# Patient Record
Sex: Male | Born: 1960 | Race: Black or African American | Hispanic: No | Smoking: Current every day smoker
Health system: Southern US, Community
[De-identification: ages and names within clinical notes are randomized; demographics above are authoritative.]

## PROBLEM LIST (undated history)

## (undated) DIAGNOSIS — N186 End stage renal disease: Secondary | ICD-10-CM

## (undated) DIAGNOSIS — E11319 Type 2 diabetes mellitus with unspecified diabetic retinopathy without macular edema: Secondary | ICD-10-CM

## (undated) DIAGNOSIS — K219 Gastro-esophageal reflux disease without esophagitis: Secondary | ICD-10-CM

## (undated) DIAGNOSIS — J449 Chronic obstructive pulmonary disease, unspecified: Secondary | ICD-10-CM

## (undated) DIAGNOSIS — Z992 Dependence on renal dialysis: Secondary | ICD-10-CM

## (undated) DIAGNOSIS — I10 Essential (primary) hypertension: Secondary | ICD-10-CM

## (undated) DIAGNOSIS — I5032 Chronic diastolic (congestive) heart failure: Secondary | ICD-10-CM

## (undated) DIAGNOSIS — R0602 Shortness of breath: Secondary | ICD-10-CM

## (undated) DIAGNOSIS — I2510 Atherosclerotic heart disease of native coronary artery without angina pectoris: Secondary | ICD-10-CM

## (undated) DIAGNOSIS — I209 Angina pectoris, unspecified: Secondary | ICD-10-CM

## (undated) DIAGNOSIS — E785 Hyperlipidemia, unspecified: Secondary | ICD-10-CM

## (undated) DIAGNOSIS — F32A Depression, unspecified: Secondary | ICD-10-CM

## (undated) DIAGNOSIS — F329 Major depressive disorder, single episode, unspecified: Secondary | ICD-10-CM

## (undated) DIAGNOSIS — M549 Dorsalgia, unspecified: Secondary | ICD-10-CM

## (undated) DIAGNOSIS — D649 Anemia, unspecified: Secondary | ICD-10-CM

## (undated) DIAGNOSIS — G4730 Sleep apnea, unspecified: Secondary | ICD-10-CM

## (undated) HISTORY — PX: COLONOSCOPY: SHX174

## (undated) HISTORY — PX: EYE SURGERY: SHX253

## (undated) HISTORY — PX: LUNG BIOPSY: SHX232

## (undated) HISTORY — DX: Anemia, unspecified: D64.9

---

## 1998-08-23 ENCOUNTER — Emergency Department (HOSPITAL_COMMUNITY): Admission: EM | Admit: 1998-08-23 | Discharge: 1998-08-23 | Payer: Self-pay | Admitting: *Deleted

## 2002-05-09 ENCOUNTER — Encounter: Payer: Self-pay | Admitting: *Deleted

## 2002-05-09 ENCOUNTER — Emergency Department (HOSPITAL_COMMUNITY): Admission: EM | Admit: 2002-05-09 | Discharge: 2002-05-09 | Payer: Self-pay | Admitting: Emergency Medicine

## 2010-07-22 ENCOUNTER — Encounter: Payer: Self-pay | Admitting: Student

## 2010-07-22 ENCOUNTER — Emergency Department (INDEPENDENT_AMBULATORY_CARE_PROVIDER_SITE_OTHER): Payer: Medicare Other

## 2010-07-22 ENCOUNTER — Emergency Department (HOSPITAL_BASED_OUTPATIENT_CLINIC_OR_DEPARTMENT_OTHER)
Admission: EM | Admit: 2010-07-22 | Discharge: 2010-07-22 | Disposition: A | Discharge status: Other Acute Inpatient Hospital | Payer: Medicare Other | Attending: Emergency Medicine | Admitting: Emergency Medicine

## 2010-07-22 ENCOUNTER — Other Ambulatory Visit: Payer: Self-pay

## 2010-07-22 DIAGNOSIS — R0609 Other forms of dyspnea: Secondary | ICD-10-CM | POA: Insufficient documentation

## 2010-07-22 DIAGNOSIS — R0602 Shortness of breath: Secondary | ICD-10-CM

## 2010-07-22 DIAGNOSIS — R739 Hyperglycemia, unspecified: Secondary | ICD-10-CM

## 2010-07-22 DIAGNOSIS — J90 Pleural effusion, not elsewhere classified: Secondary | ICD-10-CM

## 2010-07-22 DIAGNOSIS — IMO0001 Reserved for inherently not codable concepts without codable children: Secondary | ICD-10-CM

## 2010-07-22 DIAGNOSIS — R0989 Other specified symptoms and signs involving the circulatory and respiratory systems: Secondary | ICD-10-CM | POA: Insufficient documentation

## 2010-07-22 DIAGNOSIS — I509 Heart failure, unspecified: Secondary | ICD-10-CM | POA: Insufficient documentation

## 2010-07-22 DIAGNOSIS — J449 Chronic obstructive pulmonary disease, unspecified: Secondary | ICD-10-CM | POA: Insufficient documentation

## 2010-07-22 DIAGNOSIS — R079 Chest pain, unspecified: Secondary | ICD-10-CM

## 2010-07-22 DIAGNOSIS — R05 Cough: Secondary | ICD-10-CM

## 2010-07-22 DIAGNOSIS — J4489 Other specified chronic obstructive pulmonary disease: Secondary | ICD-10-CM | POA: Insufficient documentation

## 2010-07-22 DIAGNOSIS — I10 Essential (primary) hypertension: Secondary | ICD-10-CM | POA: Insufficient documentation

## 2010-07-22 DIAGNOSIS — E1169 Type 2 diabetes mellitus with other specified complication: Secondary | ICD-10-CM | POA: Insufficient documentation

## 2010-07-22 DIAGNOSIS — M549 Dorsalgia, unspecified: Secondary | ICD-10-CM | POA: Insufficient documentation

## 2010-07-22 HISTORY — DX: Chronic obstructive pulmonary disease, unspecified: J44.9

## 2010-07-22 HISTORY — DX: Atherosclerotic heart disease of native coronary artery without angina pectoris: I25.10

## 2010-07-22 HISTORY — DX: Essential (primary) hypertension: I10

## 2010-07-22 HISTORY — DX: Dorsalgia, unspecified: M54.9

## 2010-07-22 LAB — CARDIAC PANEL(CRET KIN+CKTOT+MB+TROPI)
Relative Index: 1.1 (ref 0.0–2.5)
Total CK: 330 U/L — ABNORMAL HIGH (ref 7–232)

## 2010-07-22 LAB — COMPREHENSIVE METABOLIC PANEL
AST: 51 U/L — ABNORMAL HIGH (ref 0–37)
BUN: 24 mg/dL — ABNORMAL HIGH (ref 6–23)
CO2: 32 mEq/L (ref 19–32)
Chloride: 98 mEq/L (ref 96–112)
Creatinine, Ser: 1.8 mg/dL — ABNORMAL HIGH (ref 0.50–1.35)
GFR calc Af Amer: 49 mL/min — ABNORMAL LOW (ref >60)
GFR calc non Af Amer: 40 mL/min — ABNORMAL LOW (ref >60)
Glucose, Bld: 428 mg/dL — ABNORMAL HIGH (ref 70–99)
Total Bilirubin: 0.3 mg/dL (ref 0.3–1.2)

## 2010-07-22 LAB — CBC
HCT: 34.2 % — ABNORMAL LOW (ref 39.0–52.0)
Hemoglobin: 11 g/dL — ABNORMAL LOW (ref 13.0–17.0)
MCV: 86.8 fL (ref 78.0–100.0)
Platelets: 363 10*3/uL (ref 150–400)
RBC: 3.94 MIL/uL — ABNORMAL LOW (ref 4.22–5.81)
WBC: 7.7 10*3/uL (ref 4.0–10.5)

## 2010-07-22 MED ORDER — FUROSEMIDE 10 MG/ML IJ SOLN
60.0000 mg | Freq: Once | INTRAMUSCULAR | Status: AC
  Administered 2010-07-22: 60 mg via INTRAVENOUS
  Filled 2010-07-22: qty 10

## 2010-07-22 MED ORDER — NITROGLYCERIN 0.4 MG SL SUBL
0.4000 mg | SUBLINGUAL_TABLET | SUBLINGUAL | Status: AC | PRN
  Administered 2010-07-22 (×3): 0.4 mg via SUBLINGUAL

## 2010-07-22 MED ORDER — NITROGLYCERIN 0.4 MG SL SUBL
0.4000 mg | SUBLINGUAL_TABLET | SUBLINGUAL | Status: DC | PRN
  Administered 2010-07-22: 0.4 mg via SUBLINGUAL

## 2010-07-22 MED ORDER — INSULIN ASPART PROT & ASPART (70-30 MIX) 100 UNIT/ML ~~LOC~~ SUSP
10.0000 [IU] | Freq: Once | SUBCUTANEOUS | Status: AC
  Administered 2010-07-22: 10 [IU] via SUBCUTANEOUS
  Filled 2010-07-22 (×2): qty 3

## 2010-07-22 NOTE — ED Notes (Signed)
Patient is resting comfortably. Breathing easier and less tachypnea noted. RR rate at 22 even/unlaboured. Reports pain is decreasing to 6/10 s/p nitro SL. SBP down to 195/95. Pt showing multiform PVCs on 12 lead monitor. 2 attempts at IV placement unsuccessful, charge RN notified.

## 2010-07-22 NOTE — ED Notes (Signed)
All reports given to [facility], report to be called enroute to [facility].

## 2010-07-22 NOTE — ED Notes (Signed)
Room assignment received - 608 at [facility]. Wife informed.

## 2010-07-22 NOTE — ED Provider Notes (Addendum)
History     Chief Complaint  Patient presents with  . Shortness of Breath    SOB N V CP Edema   HPIPt presents with c/o shortness of breath.  Has been feeling this way over past several days, worse today.   Can only walk 2-3 steps without becoming more dyspneic.  Coughed up some pink sputum yesterday.  No fever/chills. Also has chronic lower extremity swelling which is better than usual.  Also notes facial edema especially Of eyelids.  Denies chest pain.  No fever.  Recently had diuretic dose increased.  And also recent hospitalization At [facility]- cardiologist is [facility]- Dr. Claiborne Billings.   Past Medical History  Diagnosis Date  . CHF (congestive heart failure)   . COPD (chronic obstructive pulmonary disease)   . Coronary artery disease   . Diabetes mellitus   . Hypertension   . Back pain     No past surgical history on file.  No family history on file.  History  Substance Use Topics  . Smoking status: Former Research scientist (life sciences)  . Smokeless tobacco: Never Used  . Alcohol Use: No      Review of Systems  Physical Exam  BP 210/120  Pulse 88  Temp(Src) 98.8 F (37.1 C) (Oral)  Resp 26  Wt 328 lb (148.78 kg)  SpO2 94%  Physical Exam  Vitals reviewed.  Gen- awake, alert, NAD Eyes- PERRL, EOMI, normal appearance HEENT- MMM, OP clear, [place] O2 in place Neck- supple, from Lungs-,decreased  breath sounds,crackles 1/2 way up lung fields + accessory muscle movement +dyspnea CV-RRR, no murmur/rub/gallop Abd- soft, nontender, mildly distended, normal active bowel sounds GU- no CVA tenderness Ext- 2-3+  peripheralpitting  Edema- up to knees bilaterally and symmetric, neurovascularly intact, 2+ dp pulses  ROS reviewed and otherwise negative except for mentioned in HPI  ED Course  Procedures Date: 07/22/2010  Rate: 83  Rhythm: normal sinus rhythm  QRS Axis: normal  Intervals: normal  ST/T Wave abnormalities: nonspecific ST/T changes  Conduction Disutrbances:none  Narrative  Interpretation: low voltage in frontal leads,   Old EKG Reviewed: none available    MDM CXR reviewed by me as well- c/w CHF Multiple attempts for IV, including ultrasound guided IV by myself- initially successful then became dislodged.  Finally right brachial IV placed.  Pt given lasix IM, also nitroglycerin for CHF and hypertension.  Pt has had at least 1.5Liters urine output.  Blood glucose elevated in 400s, given insulin SubQ 10 units.  Pt much less dyspneic on 6 liters [place].  Will need admission for CHF exacerbation.  Contacting [facility] for admission and plan to transfer to [place] [facility].   D/w [facility] cardiology- he recommended hospitalist admission, discussed with [facility] hospitalist and she sees patient just discharged from [facility] hospitalist service- paging that hospitalist now.  Report given to Dr. Maeola Harman, Regional hospitalist- pt to be transferred to tele bed- was discharged from their service 07/18/10.  Threasa Beards, MD 07/22/10 [place], MD 08/31/10 [phone]

## 2010-07-22 NOTE — ED Notes (Signed)
Pt in with c/o extreme SOB, N V CP Lower back pain that started yesterday, peripheral edema noted bilaterally and to his face. Pt unsure of weight gain over past 24 hrs. laboured breathing ntoed with in/expiratory wheezes noted. Pt reports tenacious cough with thick secreations. SOB with and without exertion, 59% o2 sats on 2L - pt bumped to 6L [place] to 94% and reports decreased effort to breath. EKG at bedside. Airway patent and pt able to maintain airway appropriately. Pt hypertensive and per wife, was able to take am meds this morning.

## 2010-12-12 ENCOUNTER — Encounter (HOSPITAL_BASED_OUTPATIENT_CLINIC_OR_DEPARTMENT_OTHER): Payer: Self-pay

## 2010-12-12 ENCOUNTER — Inpatient Hospital Stay (HOSPITAL_BASED_OUTPATIENT_CLINIC_OR_DEPARTMENT_OTHER)
Admission: EM | Admit: 2010-12-12 | Discharge: 2010-12-16 | DRG: 292 | Disposition: A | Discharge status: Home / Self Care | Payer: Medicare Other | Attending: Internal Medicine | Admitting: Internal Medicine

## 2010-12-12 ENCOUNTER — Emergency Department (INDEPENDENT_AMBULATORY_CARE_PROVIDER_SITE_OTHER): Payer: Medicare Other

## 2010-12-12 DIAGNOSIS — I509 Heart failure, unspecified: Secondary | ICD-10-CM | POA: Diagnosis present

## 2010-12-12 DIAGNOSIS — E162 Hypoglycemia, unspecified: Secondary | ICD-10-CM | POA: Diagnosis not present

## 2010-12-12 DIAGNOSIS — Z87891 Personal history of nicotine dependence: Secondary | ICD-10-CM

## 2010-12-12 DIAGNOSIS — D631 Anemia in chronic kidney disease: Secondary | ICD-10-CM | POA: Diagnosis present

## 2010-12-12 DIAGNOSIS — I5033 Acute on chronic diastolic (congestive) heart failure: Secondary | ICD-10-CM | POA: Diagnosis present

## 2010-12-12 DIAGNOSIS — N179 Acute kidney failure, unspecified: Secondary | ICD-10-CM | POA: Diagnosis present

## 2010-12-12 DIAGNOSIS — N189 Chronic kidney disease, unspecified: Secondary | ICD-10-CM | POA: Diagnosis present

## 2010-12-12 DIAGNOSIS — Z5900 Homelessness unspecified: Secondary | ICD-10-CM

## 2010-12-12 DIAGNOSIS — R0602 Shortness of breath: Secondary | ICD-10-CM

## 2010-12-12 DIAGNOSIS — I10 Essential (primary) hypertension: Secondary | ICD-10-CM | POA: Diagnosis present

## 2010-12-12 DIAGNOSIS — I517 Cardiomegaly: Secondary | ICD-10-CM

## 2010-12-12 DIAGNOSIS — Z794 Long term (current) use of insulin: Secondary | ICD-10-CM

## 2010-12-12 DIAGNOSIS — N039 Chronic nephritic syndrome with unspecified morphologic changes: Secondary | ICD-10-CM | POA: Diagnosis present

## 2010-12-12 DIAGNOSIS — J90 Pleural effusion, not elsewhere classified: Secondary | ICD-10-CM

## 2010-12-12 DIAGNOSIS — Z7982 Long term (current) use of aspirin: Secondary | ICD-10-CM

## 2010-12-12 DIAGNOSIS — E119 Type 2 diabetes mellitus without complications: Secondary | ICD-10-CM | POA: Diagnosis present

## 2010-12-12 DIAGNOSIS — F1020 Alcohol dependence, uncomplicated: Secondary | ICD-10-CM | POA: Diagnosis present

## 2010-12-12 DIAGNOSIS — N183 Chronic kidney disease, stage 3 unspecified: Secondary | ICD-10-CM | POA: Diagnosis present

## 2010-12-12 DIAGNOSIS — E7800 Pure hypercholesterolemia, unspecified: Secondary | ICD-10-CM | POA: Diagnosis present

## 2010-12-12 DIAGNOSIS — E1169 Type 2 diabetes mellitus with other specified complication: Secondary | ICD-10-CM | POA: Diagnosis present

## 2010-12-12 HISTORY — DX: Hyperlipidemia, unspecified: E78.5

## 2010-12-12 LAB — GLUCOSE, CAPILLARY
Glucose-Capillary: 218 mg/dL — ABNORMAL HIGH (ref 70–99)
Glucose-Capillary: 261 mg/dL — ABNORMAL HIGH (ref 70–99)

## 2010-12-12 LAB — CBC
HCT: 28.5 % — ABNORMAL LOW (ref 39.0–52.0)
HCT: 29.2 % — ABNORMAL LOW (ref 39.0–52.0)
Hemoglobin: 9.4 g/dL — ABNORMAL LOW (ref 13.0–17.0)
MCHC: 31.6 g/dL (ref 30.0–36.0)
MCV: 92.8 fL (ref 78.0–100.0)
MCV: 92.1 fL (ref 78.0–100.0)
Platelets: 214 10*3/uL (ref 150–400)
Platelets: 202 10*3/uL (ref 150–400)
RBC: 3.17 MIL/uL — ABNORMAL LOW (ref 4.22–5.81)
RDW: 13.5 % (ref 11.5–15.5)
WBC: 6.9 10*3/uL (ref 4.0–10.5)

## 2010-12-12 LAB — BASIC METABOLIC PANEL
Calcium: 8.9 mg/dL (ref 8.4–10.5)
Creatinine, Ser: 2 mg/dL — ABNORMAL HIGH (ref 0.50–1.35)
GFR calc Af Amer: 43 mL/min — ABNORMAL LOW (ref >90)

## 2010-12-12 LAB — DIFFERENTIAL
Basophils Absolute: 0.1 10*3/uL (ref 0.0–0.1)
Basophils Relative: 1 % (ref 0–1)
Eosinophils Relative: 4 % (ref 0–5)
Monocytes Absolute: 0.5 10*3/uL (ref 0.1–1.0)
Neutro Abs: 4.7 10*3/uL (ref 1.7–7.7)

## 2010-12-12 LAB — MAGNESIUM: Magnesium: 2.1 mg/dL (ref 1.5–2.5)

## 2010-12-12 LAB — CARDIAC PANEL(CRET KIN+CKTOT+MB+TROPI): Total CK: 1154 U/L — ABNORMAL HIGH (ref 7–232)

## 2010-12-12 LAB — PRO B NATRIURETIC PEPTIDE: Pro B Natriuretic peptide (BNP): 728.6 pg/mL — ABNORMAL HIGH (ref 0–125)

## 2010-12-12 LAB — CREATININE, SERUM: GFR calc Af Amer: 42 mL/min — ABNORMAL LOW (ref >90)

## 2010-12-12 LAB — RAPID URINE DRUG SCREEN, HOSP PERFORMED
Amphetamines: NOT DETECTED
Barbiturates: NOT DETECTED
Tetrahydrocannabinol: NOT DETECTED

## 2010-12-12 MED ORDER — INSULIN ASPART 100 UNIT/ML ~~LOC~~ SOLN
6.0000 [IU] | Freq: Three times a day (TID) | SUBCUTANEOUS | Status: DC
  Administered 2010-12-13 – 2010-12-16 (×8): 6 [IU] via SUBCUTANEOUS

## 2010-12-12 MED ORDER — BACLOFEN 20 MG PO TABS
20.0000 mg | ORAL_TABLET | Freq: Two times a day (BID) | ORAL | Status: DC
  Administered 2010-12-12 – 2010-12-16 (×8): 20 mg via ORAL
  Filled 2010-12-12 (×9): qty 1

## 2010-12-12 MED ORDER — FUROSEMIDE 10 MG/ML IJ SOLN
80.0000 mg | Freq: Once | INTRAMUSCULAR | Status: AC
  Administered 2010-12-12: 80 mg via INTRAVENOUS
  Filled 2010-12-12: qty 8

## 2010-12-12 MED ORDER — SODIUM CHLORIDE 0.9 % IJ SOLN
3.0000 mL | Freq: Two times a day (BID) | INTRAMUSCULAR | Status: DC
  Administered 2010-12-12 – 2010-12-16 (×8): 3 mL via INTRAVENOUS

## 2010-12-12 MED ORDER — TIMOLOL HEMIHYDRATE 0.25 % OP SOLN: 1.0000 [drp] | Freq: Two times a day (BID) | OPHTHALMIC | Status: DC

## 2010-12-12 MED ORDER — CARVEDILOL 3.125 MG PO TABS
3.1250 mg | ORAL_TABLET | Freq: Two times a day (BID) | ORAL | Status: DC
  Administered 2010-12-13 – 2010-12-16 (×6): 3.125 mg via ORAL
  Filled 2010-12-12 (×9): qty 1

## 2010-12-12 MED ORDER — HYDRALAZINE HCL 50 MG PO TABS
100.0000 mg | ORAL_TABLET | Freq: Three times a day (TID) | ORAL | Status: DC
  Administered 2010-12-12 – 2010-12-16 (×11): 100 mg via ORAL
  Filled 2010-12-12 (×14): qty 2

## 2010-12-12 MED ORDER — RAMIPRIL 5 MG PO CAPS
5.0000 mg | ORAL_CAPSULE | Freq: Every day | ORAL | Status: DC
  Administered 2010-12-12 – 2010-12-13 (×2): 5 mg via ORAL
  Filled 2010-12-12 (×2): qty 1

## 2010-12-12 MED ORDER — ASPIRIN 81 MG PO TBEC: 81.0000 mg | DELAYED_RELEASE_TABLET | Freq: Every day | ORAL | Status: DC

## 2010-12-12 MED ORDER — ROSUVASTATIN CALCIUM 40 MG PO TABS
40.0000 mg | ORAL_TABLET | Freq: Every day | ORAL | Status: DC
  Administered 2010-12-12 – 2010-12-15 (×4): 40 mg via ORAL
  Filled 2010-12-12 (×5): qty 1

## 2010-12-12 MED ORDER — ISOSORBIDE MONONITRATE ER 60 MG PO TB24
120.0000 mg | ORAL_TABLET | Freq: Every day | ORAL | Status: DC
  Administered 2010-12-13 – 2010-12-16 (×4): 120 mg via ORAL
  Filled 2010-12-12 (×4): qty 2

## 2010-12-12 MED ORDER — ONDANSETRON HCL 4 MG/2ML IJ SOLN: 4.0000 mg | Freq: Four times a day (QID) | INTRAMUSCULAR | Status: DC | PRN

## 2010-12-12 MED ORDER — ZOLPIDEM TARTRATE 5 MG PO TABS: 5.0000 mg | ORAL_TABLET | Freq: Every evening | ORAL | Status: DC | PRN

## 2010-12-12 MED ORDER — ACETAMINOPHEN 325 MG PO TABS
650.0000 mg | ORAL_TABLET | ORAL | Status: DC | PRN
Start: 2010-12-12 — End: 2010-12-16
  Administered 2010-12-16: 650 mg via ORAL
  Filled 2010-12-12: qty 2

## 2010-12-12 MED ORDER — BUPROPION HCL ER (SR) 150 MG PO TB12
150.0000 mg | ORAL_TABLET | Freq: Two times a day (BID) | ORAL | Status: DC
  Administered 2010-12-12 – 2010-12-16 (×8): 150 mg via ORAL
  Filled 2010-12-12 (×9): qty 1

## 2010-12-12 MED ORDER — INSULIN GLARGINE 100 UNIT/ML ~~LOC~~ SOLN: 15.0000 [IU] | Freq: Every day | SUBCUTANEOUS | Status: DC

## 2010-12-12 MED ORDER — SODIUM CHLORIDE 0.9 % IJ SOLN: 3.0000 mL | INTRAMUSCULAR | Status: DC | PRN

## 2010-12-12 MED ORDER — INSULIN DETEMIR 100 UNIT/ML ~~LOC~~ SOLN
40.0000 [IU] | Freq: Every day | SUBCUTANEOUS | Status: DC
  Administered 2010-12-13: 40 [IU] via SUBCUTANEOUS
  Filled 2010-12-12: qty 3

## 2010-12-12 MED ORDER — INSULIN ASPART 100 UNIT/ML ~~LOC~~ SOLN
0.0000 [IU] | Freq: Every day | SUBCUTANEOUS | Status: DC
  Administered 2010-12-12: 3 [IU] via SUBCUTANEOUS
  Administered 2010-12-13: 0 [IU] via SUBCUTANEOUS
  Administered 2010-12-14: 2 [IU] via SUBCUTANEOUS

## 2010-12-12 MED ORDER — ARIPIPRAZOLE 5 MG PO TABS
5.0000 mg | ORAL_TABLET | Freq: Every day | ORAL | Status: DC
  Administered 2010-12-12 – 2010-12-16 (×5): 5 mg via ORAL
  Filled 2010-12-12 (×7): qty 1

## 2010-12-12 MED ORDER — CITALOPRAM HYDROBROMIDE 20 MG PO TABS
20.0000 mg | ORAL_TABLET | Freq: Every day | ORAL | Status: DC
  Administered 2010-12-13 – 2010-12-16 (×4): 20 mg via ORAL
  Filled 2010-12-12 (×4): qty 1

## 2010-12-12 MED ORDER — FUROSEMIDE 10 MG/ML IJ SOLN
80.0000 mg | Freq: Two times a day (BID) | INTRAMUSCULAR | Status: DC
  Administered 2010-12-13: 80 mg via INTRAVENOUS
  Filled 2010-12-12 (×4): qty 8

## 2010-12-12 MED ORDER — SODIUM CHLORIDE 0.9 % IV SOLN
250.0000 mL | INTRAVENOUS | Status: DC | PRN
Start: 2010-12-12 — End: 2010-12-16

## 2010-12-12 MED ORDER — TIMOLOL MALEATE 0.25 % OP SOLN
1.0000 [drp] | Freq: Two times a day (BID) | OPHTHALMIC | Status: DC
  Administered 2010-12-12 – 2010-12-16 (×8): 1 [drp] via OPHTHALMIC
  Filled 2010-12-12 (×2): qty 5

## 2010-12-12 MED ORDER — PANTOPRAZOLE SODIUM 40 MG PO TBEC
40.0000 mg | DELAYED_RELEASE_TABLET | Freq: Two times a day (BID) | ORAL | Status: DC
  Administered 2010-12-12 – 2010-12-16 (×8): 40 mg via ORAL
  Filled 2010-12-12 (×6): qty 1

## 2010-12-12 MED ORDER — INSULIN ASPART 100 UNIT/ML ~~LOC~~ SOLN
0.0000 [IU] | Freq: Three times a day (TID) | SUBCUTANEOUS | Status: DC
  Administered 2010-12-13 (×2): 3 [IU] via SUBCUTANEOUS
  Administered 2010-12-14 – 2010-12-16 (×4): 4 [IU] via SUBCUTANEOUS
  Administered 2010-12-16: 3 [IU] via SUBCUTANEOUS
  Filled 2010-12-12: qty 3

## 2010-12-12 MED ORDER — HYDRALAZINE HCL 100 MG PO TABS: 100.0000 mg | ORAL_TABLET | Freq: Three times a day (TID) | ORAL | Status: DC

## 2010-12-12 MED ORDER — PERPHENAZINE 2 MG PO TABS
2.0000 mg | ORAL_TABLET | Freq: Every day | ORAL | Status: DC
  Administered 2010-12-12 – 2010-12-15 (×4): 2 mg via ORAL
  Filled 2010-12-12 (×7): qty 1

## 2010-12-12 MED ORDER — ASPIRIN EC 81 MG PO TBEC
81.0000 mg | DELAYED_RELEASE_TABLET | Freq: Every day | ORAL | Status: DC
  Administered 2010-12-12 – 2010-12-16 (×5): 81 mg via ORAL
  Filled 2010-12-12 (×5): qty 1

## 2010-12-12 MED ORDER — ENOXAPARIN SODIUM 40 MG/0.4ML ~~LOC~~ SOLN
40.0000 mg | SUBCUTANEOUS | Status: DC
  Administered 2010-12-12 – 2010-12-15 (×4): 40 mg via SUBCUTANEOUS
  Filled 2010-12-12 (×5): qty 0.4

## 2010-12-12 NOTE — ED Provider Notes (Signed)
History     CSN: [id number] Arrival date & time: 12/12/2010  9:12 AM   First MD Initiated Contact with Patient 12/12/10 [phone]      Chief Complaint  Patient presents with  . Leg Swelling  . Shortness of Breath    (Consider location/radiation/quality/duration/timing/severity/associated sxs/prior treatment) HPI  Past Medical History  Diagnosis Date  . CHF (congestive heart failure)   . COPD (chronic obstructive pulmonary disease)   . Coronary artery disease   . Diabetes mellitus   . Hypertension   . Back pain   . Hyperlipemia   . Renal insufficiency     Past Surgical History  Procedure Date  . Eye surgery   . Lung biopsy     No family history on file.  History  Substance Use Topics  . Smoking status: Current Everyday Smoker -- 0.5 packs/day    Types: Cigarettes  . Smokeless tobacco: Never Used  . Alcohol Use: Yes     states he drinks 1/5 of liquor dailt- last drink 30 days ago      Review of Systems  All other systems reviewed and are negative.    Allergies  Review of patient's allergies indicates no known allergies.  Home Medications   Current Outpatient Rx  Name Route Sig Dispense Refill  . ALBUTEROL SULFATE (2.5 MG/3ML) 0.083% IN NEBU Inhalation Inhale 2.5 mg into the lungs every 4 (four) hours as needed.      . ASPIRIN 81 MG PO TBEC Oral Take 81 mg by mouth daily.      Marland Kitchen HYDRALAZINE HCL 100 MG PO TABS Oral Take 100 mg by mouth 3 (three) times daily.      . INSULIN ASPART 100 UNIT/ML  SOLN Subcutaneous Inject 8 Units into the skin 3 (three) times daily before meals.      . TORSEMIDE 20 MG PO TABS Oral Take 20 mg by mouth 2 (two) times daily.        BP 188/84  Pulse 90  Temp(Src) 97.7 F (36.5 C) (Oral)  Resp 20  Ht 5\' 7"  (1.702 m)  Wt 279 lb (126.554 kg)  BMI 43.70 kg/m2  SpO2 98%  Physical Exam  Constitutional: He is oriented to person, place, and time.       Morbidly obese male  HENT:  Head: Normocephalic and atraumatic.  Eyes:  Conjunctivae and EOM are normal. Pupils are equal, round, and reactive to light.  Neck: Normal range of motion. Neck supple.  Cardiovascular: Normal rate, regular rhythm, normal heart sounds and intact distal pulses.   Pulmonary/Chest:       Decreased bs bilaterally with crackles  Abdominal: Soft. Bowel sounds are normal.  Musculoskeletal: He exhibits edema. He exhibits no tenderness.  Neurological: He is alert and oriented to person, place, and time. He has normal reflexes.  Skin: Skin is warm and dry.  Psychiatric: He has a normal mood and affect.    ED Course  Procedures (including critical care time)  Labs Reviewed - No data to display No results found.   No diagnosis found.    MDM   Date: 12/12/2010  Rate: 87  Rhythm: normal sinus rhythm  QRS Axis: normal  Intervals: normal  ST/T Wave abnormalities: normal  Conduction Disutrbances:none  Narrative Interpretation:   Old EKG Reviewed: unchanged   Patient diuresed here with Lasix 80 mg IV. Patient has been a 1 L of normal saline. Patient is normally admitted [facility]. Discussed his care with the [facility]ist at  [facility]. We are currently awaiting bed assignment. Patient's blood pressures remained stable with a systolic 0000000. His heart rate is in the 90s. His total CK is elevated at his troponin is normal. He will be transported to [facility] for further treatment of his congestive heart failure exacerbation        Shaune Pollack, MD 12/17/10 [phone]

## 2010-12-12 NOTE — ED Notes (Signed)
Pt returned from radiology.

## 2010-12-12 NOTE — ED Notes (Signed)
Secondary assessment- pt reports SHOB and generalized swelling in extremities x 3 days.  Denies chest pain.  Bilateral LE pitting edema noted.  Pt reports chronic mild swelling but swelling the past 3 days is worse.  Swelling noted to bilateral hands.

## 2010-12-12 NOTE — ED Notes (Signed)
Meal tray provided.  Urine sent as ordered.  Pt in NAD at present time and updated of plan of care.  Awaiting a bed.

## 2010-12-12 NOTE — Progress Notes (Signed)
Pt admitted to unit. Pt oriented to unit. Pt assessed and VS taken. Pt does not appear in any immediate distress.  Dr. Elenor Legato notified of pt's arrival to unit. Pt given "Living better with heart failure" packet.  Will continue to monitor.

## 2010-12-12 NOTE — ED Notes (Signed)
Attempted IV access x 1 attempt unsuccessful.

## 2010-12-12 NOTE — ED Notes (Signed)
Pt reports SHOB and O2 at [facility] applied.  Telemetry in place and NSR per monitor

## 2010-12-12 NOTE — ED Notes (Signed)
Patient transported to X-ray 

## 2010-12-12 NOTE — ED Notes (Signed)
Pt reports he had an episode of chest pain last night but denies any today. Dr. Jeanell Sparrow informed of lab results.

## 2010-12-12 NOTE — ED Notes (Signed)
[facility] at bedside for transfer.  Pt stable upon transfer to [place].

## 2010-12-12 NOTE — ED Notes (Signed)
[facility] has been called for transfer to [id number] at [facility]

## 2010-12-12 NOTE — ED Notes (Signed)
Report called to Joellen Jersey, RN unit RN

## 2010-12-12 NOTE — ED Notes (Signed)
Pt reports a 3 day hx of generalized swelling and SHOB.

## 2010-12-12 NOTE — H&P (Signed)
PATIENT DETAILS Name: Jon Russell Age: 50 y.o. Sex: male Date of Birth: 04-29-1960 Admit Date: 12/12/2010 PCP:No primary provider on file.   CHIEF COMPLAINT: My legs are swollen up Shortness of breath  HPI: This is a 50 year old man, homeless, alcoholic and drug abuser, with multiple medical problems including coronary artery disease, CHF, COPD, chronic renal insufficiency, diabetes and hypertension. [facility] because of worsening swelling of the lower extremities over the last 2-3 days associated to dyspnea on exertion and at rest. He did not complain of chest pain or palpitations. He denied fever or chills. Denied cough or sputum production.  Initial evaluation at [facility] revealed that the patient was in congestive heart failure. He received Lasix 80 mg IV with good diuresis. His shortness of breath improved. His initial cardiac markers were elevated except troponin. His urine drug screen was negative. He remains in renal failure with a BUN of 32 and creatinine of 2.0. Subsequently we were contacted for transfer to [place]:  No Known Allergies  PAST MEDICAL HISTORY: Past Medical History  Diagnosis Date  . CHF (congestive heart failure) patient cardiac care has been given at [facility] No cath or echo found on E chart   . COPD (chronic obstructive pulmonary disease)   . Coronary artery disease   . Diabetes mellitus   . Hypertension   . Back pain   . Hyperlipemia   . Renal insufficiency     PAST SURGICAL HISTORY: Past Surgical History  Procedure Date  . Eye surgery   . Lung biopsy     MEDICATIONS AT HOME: Prior to Admission medications   Medication Sig Start Date End Date Taking? Authorizing Provider  ARIPiprazole (ABILIFY) 5 MG tablet Take 5 mg by mouth daily.     Yes Historical Provider, MD  aspirin 81 MG EC tablet Take 81 mg by mouth daily.    Yes Historical Provider, MD    atorvastatin (LIPITOR) 40 MG tablet Take 40 mg by mouth daily.     Yes Historical Provider, MD  baclofen (LIORESAL) 20 MG tablet Take 20 mg by mouth 2 (two) times daily.     Yes Historical Provider, MD  buPROPion (WELLBUTRIN SR) 150 MG 12 hr tablet Take 150 mg by mouth 2 (two) times daily.     Yes Historical Provider, MD  citalopram (CELEXA) 20 MG tablet Take 20 mg by mouth daily.     Yes Historical Provider, MD  hydrALAZINE (APRESOLINE) 100 MG tablet Take 100 mg by mouth 3 (three) times daily.    Yes Historical Provider, MD  ibuprofen (ADVIL,MOTRIN) 200 MG tablet Take 800 mg by mouth every 8 (eight) hours as needed.     Yes Historical Provider, MD  insulin aspart (NOVOLOG) 100 UNIT/ML injection Inject 8 Units into the skin 3 (three) times daily before meals. Sliding scale   Yes Historical Provider, MD  insulin detemir (LEVEMIR) 100 UNIT/ML injection Inject 40 Units into the skin daily.     Yes Historical Provider, MD  isosorbide mononitrate (IMDUR) 60 MG 24 hr tablet Take 120 mg by mouth daily.     Yes Historical Provider, MD  methylcellulose (ARTIFICIAL TEARS) 1 % ophthalmic solution 1 drop as needed. For dry eye    Yes Historical Provider, MD  NIFEdipine (PROCARDIA XL/ADALAT-CC) 60 MG 24 hr tablet Take 60 mg by mouth daily.     Yes Historical Provider, MD  pantoprazole (  PROTONIX) 40 MG tablet Take 40 mg by mouth 2 (two) times daily.     Yes Historical Provider, MD  perphenazine (TRILAFON) 2 MG tablet Take 2 mg by mouth at bedtime.     Yes Historical Provider, MD  timolol (BETIMOL) 0.25 % ophthalmic solution Place 1 drop into both eyes 2 (two) times daily as needed.     Yes Historical Provider, MD  torsemide (DEMADEX) 20 MG tablet Take 20 mg by mouth 2 (two) times daily.    Yes Historical Provider, MD  albuterol (PROVENTIL) (2.5 MG/3ML) 0.083% nebulizer solution Inhale 2.5 mg into the lungs every 4 (four) hours as needed.      Historical Provider, MD    FAMILY HISTORY: "all the conditions that  I have they have"  SOCIAL HISTORY: Patient is homeless. He lives at [place]. He admits to alcohol and drug abuse but states that he quit 30 days ago. He has no family.  REVIEW OF SYSTEMS:  Constitutional:   Positive for night sweats but no fever chills or weight loss HEENT:    No headaches, Difficulty swallowing,Tooth/dental problems,Sore throat,  No sneezing, itching, ear ache, nasal congestion, post nasal drip,   Cardio-vascular: Complains of nocturia and orthopnea. No PND   GI:  No heartburn, indigestion, abdominal pain, nausea, vomiting, diarrhea, change in       bowel habits, loss of appetite  Resp: As in history of present illness  Skin:  no rash or lesions.  GU:  Occasional urinary hesitance no dysuria or hematuria   Musculoskeletal: No joint pain or swelling.  No decreased range of motion.  No back pain.  Marland Kitchen   PHYSICAL EXAM: Blood pressure 174/77, pulse 88, temperature 97.8 F (36.6 C), temperature source Oral, resp. rate 18, height 5\' 7"  (1.702 m), weight 268 lb 1.3 oz (121.6 kg), SpO2 97.00%.  General appearance :Awake, alert, not in any distress. Speech Clear. Obese  HEENT: Atraumatic and Normocephalic, pupils equally reactive to light and accomodation Neck: supple, no JVD. No cervical lymphadenopathy.  Chest: Diminished air entry bilaterally. Bilateral crackles at the bases  CVS: S1 S2 regular, no murmurs. No gallops Abdomen: Bowel sounds present, mildly tender in the lower quadrants not distended with no gaurding, rigidity or rebound. Extremities: 3+ edema edema bilaterally  Neurology: Awake alert, and oriented X 3, CN II-XII intact, Non focal, Deep Tendon Reflex-2+ all over, plantar's downgoing B/L, sensory exam is grossly intact.  Skin:No Rash Wounds:N/A  LABS ON ADMISSION:   Basename 12/12/10 1025  NA 140  K 4.8  CL 105  CO2 27  GLUCOSE 119*  BUN 32*  CREATININE 2.00*  CALCIUM 8.9  MG --  PHOS --   No results found for this basename:  AST:2,ALT:2,ALKPHOS:2,BILITOT:2,PROT:2,ALBUMIN:2 in the last 72 hours No results found for this basename: LIPASE:2,AMYLASE:2 in the last 72 hours  Basename 12/12/10 1025  WBC 7.3  NEUTROABS 4.7  HGB 9.0*  HCT 28.5*  MCV 92.8  PLT 214    Basename 12/12/10 1025  CKTOTAL 1154*  CKMB 8.3*  CKMBINDEX --  TROPONINI <0.30   No results found for this basename: DDIMER:2 in the last 72 hours  Basename 12/12/10 1025  POCBNP 728.6*     RADIOLOGIC STUDIES ON ADMISSION: Dg Chest 2 View  12/12/2010  *RADIOLOGY REPORT*  Clinical Data: Shortness of breath, COPD, smoking history  CHEST - 2 VIEW  Comparison: Portable chest x-ray of 07/22/2010  Findings: There is cardiomegaly present with mild pulmonary vascular congestion and small effusions most  consistent with mild congestive heart failure.  No focal abnormality is seen.  No bony abnormality is noted.  IMPRESSION: Probable CHF with cardiomegaly, pulmonary vascular congestion, and small effusions.  Original Report Authenticated By: Joretta Bachelor, M.D.    ASSESSMENT AND PLAN: Present on Admission:  CHF .HTN (hypertension) .Diabetes type 2, controlled .Anemia associated with chronic renal failure .CRF (chronic renal failure) .Hypercholesterolemia .Alcohol dependence  Admit patient to telemetry unit and follow congestive heart failure protocol Continue serial cardiac enzymes Obtain echocardiogram Monitor electrolytes and BUN and creatinine Repeat EKG in a.m. Repeat chest x-ray in a.m. Continue Lasix 80 mg IV every 12 hour Start ramipril 2.5 mg twice a day Start Coreg 3.25 mg twice a day Discontinue NSAIDs Continue aspirin 81 mg a day Continue hydralazine 100 g every 8 hours Monitor CBG and insulin sliding scale High dose Crestor 40 mg a day Patient education on heart failure    Further plan will depend as patient's clinical course evolves and further radiologic and laboratory data become available. Patient will be monitored  closely.   DVT Prophylaxis: Lovenox  Code Status: Full code  Total time spent for admission equals 45 minutes.  Ardyth Gal 12/12/2010, 8:10 PM

## 2010-12-12 NOTE — ED Notes (Signed)
Mary, RRT at bedside attempting IV access/blood draw.

## 2010-12-13 ENCOUNTER — Inpatient Hospital Stay (HOSPITAL_COMMUNITY): Payer: Medicare Other

## 2010-12-13 ENCOUNTER — Other Ambulatory Visit: Payer: Self-pay

## 2010-12-13 DIAGNOSIS — E162 Hypoglycemia, unspecified: Secondary | ICD-10-CM | POA: Diagnosis not present

## 2010-12-13 DIAGNOSIS — N179 Acute kidney failure, unspecified: Secondary | ICD-10-CM | POA: Diagnosis not present

## 2010-12-13 LAB — BASIC METABOLIC PANEL
BUN: 27 mg/dL — ABNORMAL HIGH (ref 6–23)
CO2: 28 mEq/L (ref 19–32)
Calcium: 8.8 mg/dL (ref 8.4–10.5)
Glucose, Bld: 207 mg/dL — ABNORMAL HIGH (ref 70–99)
Sodium: 140 mEq/L (ref 135–145)

## 2010-12-13 LAB — CARDIAC PANEL(CRET KIN+CKTOT+MB+TROPI)
CK, MB: 4.6 ng/mL — ABNORMAL HIGH (ref 0.3–4.0)
Total CK: 516 U/L — ABNORMAL HIGH (ref 7–232)

## 2010-12-13 LAB — GLUCOSE, CAPILLARY
Glucose-Capillary: 41 mg/dL — CL (ref 70–99)
Glucose-Capillary: 81 mg/dL (ref 70–99)
Glucose-Capillary: 191 mg/dL — ABNORMAL HIGH (ref 70–99)

## 2010-12-13 LAB — CBC
MCH: 29.1 pg (ref 26.0–34.0)
MCV: 92.3 fL (ref 78.0–100.0)
Platelets: 207 10*3/uL (ref 150–400)
RBC: 3.23 MIL/uL — ABNORMAL LOW (ref 4.22–5.81)

## 2010-12-13 MED ORDER — INSULIN DETEMIR 100 UNIT/ML ~~LOC~~ SOLN
30.0000 [IU] | Freq: Every day | SUBCUTANEOUS | Status: DC
  Filled 2010-12-13: qty 3

## 2010-12-13 NOTE — Progress Notes (Signed)
Subjective: Patient still reports quite a bit of swelling. No dyspnea   Physical Exam: Blood pressure 168/76, pulse 80, temperature 98.3 F (36.8 C), temperature source Oral, resp. rate 20, height 5\' 7"  (1.702 m), weight 122.9 kg (270 lb 15.1 oz), SpO2 94.00%. Alert and oriented x3 CVS: RRR, S3 gallop RS: bilateral crackles Abdomen soft NT LE +3 edema  Investigations:  No results found for this or any previous visit (from the past 240 hour(s)).   Basic Metabolic Panel:  Basename 12/13/10 1000 12/12/10 2105 12/12/10 1025  NA 140 -- 140  K 4.4 -- 4.8  CL 105 -- 105  CO2 28 -- 27  GLUCOSE 207* -- 119*  BUN 27* -- 32*  CREATININE 1.93* 2.04* --  CALCIUM 8.8 -- 8.9  MG -- 2.1 --  PHOS -- -- --   Liver Function Tests: No results found for this basename: AST:2,ALT:2,ALKPHOS:2,BILITOT:2,PROT:2,ALBUMIN:2 in the last 72 hours   CBC:  Basename 12/13/10 1000 12/12/10 2105 12/12/10 1025  WBC 6.5 6.9 --  NEUTROABS -- -- 4.7  HGB 9.4* 9.4* --  HCT 29.8* 29.2* --  MCV 92.3 92.1 --  PLT 207 202 --    X-ray Chest Pa And Lateral  12/13/2010  *RADIOLOGY REPORT*  Clinical Data: Congestive heart failure  CHEST - 2 VIEW  Comparison: Chest radiograph 12/12/2010  Findings: Stable enlarged heart silhouette.  There is perihilar air space disease which is slightly increased compared to prior.  There is small bilateral pleural effusions.  No pneumothorax.  No osseous abnormality.  IMPRESSION:  Findings consistent with mild worsening of congestive heart failure.  Original Report Authenticated By: Suzy Bouchard, M.D.   Dg Chest 2 View  12/12/2010  *RADIOLOGY REPORT*  Clinical Data: Shortness of breath, COPD, smoking history  CHEST - 2 VIEW  Comparison: Portable chest x-ray of 07/22/2010  Findings: There is cardiomegaly present with mild pulmonary vascular congestion and small effusions most consistent with mild congestive heart failure.  No focal abnormality is seen.  No bony abnormality is  noted.  IMPRESSION: Probable CHF with cardiomegaly, pulmonary vascular congestion, and small effusions.  Original Report Authenticated By: Joretta Bachelor, M.D.      Medications:  Scheduled:    . ARIPiprazole  5 mg Oral Daily  . aspirin EC  81 mg Oral Daily  . baclofen  20 mg Oral BID  . buPROPion  150 mg Oral BID  . carvedilol  3.125 mg Oral BID WC  . citalopram  20 mg Oral Daily  . enoxaparin  40 mg Subcutaneous Q24H  . furosemide  80 mg Intravenous BID  . hydrALAZINE  100 mg Oral Q8H  . insulin aspart  0-20 Units Subcutaneous TID WC  . insulin aspart  0-5 Units Subcutaneous QHS  . insulin aspart  6 Units Subcutaneous TID WC  . insulin detemir  30 Units Subcutaneous Daily  . isosorbide mononitrate  120 mg Oral Daily  . pantoprazole  40 mg Oral BID  . perphenazine  2 mg Oral QHS  . rosuvastatin  40 mg Oral QHS  . sodium chloride  3 mL Intravenous Q12H  . timolol  1 drop Both Eyes BID  . DISCONTD: aspirin  81 mg Oral Daily  . DISCONTD: hydrALAZINE  100 mg Oral TID  . DISCONTD: insulin detemir  40 Units Subcutaneous Daily  . DISCONTD: insulin glargine  15 Units Subcutaneous QHS  . DISCONTD: ramipril  5 mg Oral Daily  . DISCONTD: timolol  1 drop Both Eyes BID  Impression:  Principal Problem:  *CHF (congestive heart failure) Active Problems:  HTN (hypertension)  Diabetes type 2, controlled  Anemia associated with chronic renal failure  CRF (chronic renal failure)  Hypercholesterolemia  Alcohol dependence  Hypoglycemia  ARF (acute renal failure)     Plan: Continue to diurese Decrease dose of levemir Hold ACEI F/U creatinine      LOS: 1 day   Franci Oshana, MD Pager: [phone] 12/13/2010, 5:21 PM

## 2010-12-14 LAB — CBC
MCHC: 31.8 g/dL (ref 30.0–36.0)
RDW: 13.5 % (ref 11.5–15.5)

## 2010-12-14 LAB — BASIC METABOLIC PANEL
BUN: 28 mg/dL — ABNORMAL HIGH (ref 6–23)
Creatinine, Ser: 1.89 mg/dL — ABNORMAL HIGH (ref 0.50–1.35)
GFR calc Af Amer: 46 mL/min — ABNORMAL LOW (ref >90)
GFR calc non Af Amer: 40 mL/min — ABNORMAL LOW (ref >90)
Potassium: 4.1 mEq/L (ref 3.5–5.1)

## 2010-12-14 LAB — GLUCOSE, CAPILLARY

## 2010-12-14 MED ORDER — FUROSEMIDE 10 MG/ML IJ SOLN
80.0000 mg | Freq: Four times a day (QID) | INTRAMUSCULAR | Status: DC
  Administered 2010-12-14 – 2010-12-16 (×8): 80 mg via INTRAVENOUS
  Filled 2010-12-14 (×9): qty 8

## 2010-12-14 MED ORDER — INSULIN DETEMIR 100 UNIT/ML ~~LOC~~ SOLN
20.0000 [IU] | Freq: Every day | SUBCUTANEOUS | Status: DC
  Administered 2010-12-14 – 2010-12-16 (×3): 20 [IU] via SUBCUTANEOUS

## 2010-12-14 NOTE — Progress Notes (Signed)
  Echocardiogram 2D Echocardiogram has been performed.  Vonnetta Akey, Orlena Sheldon 12/14/2010, 9:10 AM

## 2010-12-14 NOTE — Progress Notes (Signed)
Subjective: Patient still reports quite a bit of swelling. No dyspnea   Physical Exam: Blood pressure 168/73, pulse 76, temperature 98.6 F (37 C), temperature source Oral, resp. rate 20, height 5\' 7"  (1.702 m), weight 119.75 kg (264 lb), SpO2 97.00%. Alert and oriented x3 CVS: RRR, S3 gallop RS: bilateral crackles Abdomen soft NT LE +3 edema   Basic Metabolic Panel:  Basename 12/14/10 0630 12/13/10 1000 12/12/10 2105  NA 135 140 --  K 4.1 4.4 --  CL 102 105 --  CO2 28 28 --  GLUCOSE 79 207* --  BUN 28* 27* --  CREATININE 1.89* 1.93* --  CALCIUM 9.0 8.8 --  MG -- -- 2.1  PHOS -- -- --    CBC:  Basename 12/14/10 0630 12/13/10 1000 12/12/10 1025  WBC 6.6 6.5 --  NEUTROABS -- -- 4.7  HGB 9.2* 9.4* --  HCT 28.9* 29.8* --  MCV 91.7 92.3 --  PLT 217 207 --    X-ray Chest Pa And Lateral  12/13/2010  *RADIOLOGY REPORT*  Clinical Data: Congestive heart failure  CHEST - 2 VIEW  Comparison: Chest radiograph 12/12/2010  Findings: Stable enlarged heart silhouette.  There is perihilar air space disease which is slightly increased compared to prior.  There is small bilateral pleural effusions.  No pneumothorax.  No osseous abnormality.  IMPRESSION:  Findings consistent with mild worsening of congestive heart failure.  Original Report Authenticated By: Suzy Bouchard, M.D.      Medications:  Scheduled:    . ARIPiprazole  5 mg Oral Daily  . aspirin EC  81 mg Oral Daily  . baclofen  20 mg Oral BID  . buPROPion  150 mg Oral BID  . carvedilol  3.125 mg Oral BID WC  . citalopram  20 mg Oral Daily  . enoxaparin  40 mg Subcutaneous Q24H  . furosemide  80 mg Intravenous Q6H  . hydrALAZINE  100 mg Oral Q8H  . insulin aspart  0-20 Units Subcutaneous TID WC  . insulin aspart  0-5 Units Subcutaneous QHS  . insulin aspart  6 Units Subcutaneous TID WC  . insulin detemir  20 Units Subcutaneous Daily  . isosorbide mononitrate  120 mg Oral Daily  . pantoprazole  40 mg Oral BID  .  perphenazine  2 mg Oral QHS  . rosuvastatin  40 mg Oral QHS  . sodium chloride  3 mL Intravenous Q12H  . timolol  1 drop Both Eyes BID  . DISCONTD: furosemide  80 mg Intravenous BID  . DISCONTD: insulin detemir  30 Units Subcutaneous Daily   2d Echo pending   Impression:  Principal Problem:  *CHF (congestive heart failure) Active Problems:  HTN (hypertension)  Diabetes type 2, controlled  Anemia associated with chronic renal failure  CRF (chronic renal failure)  Hypercholesterolemia  Alcohol dependence  Hypoglycemia  ARF (acute renal failure)     Plan: Continue to diurese Decrease dose of levemir Hold ACEI F/U creatinine      LOS: 2 days   Henson Fraticelli, MD Pager: [phone] 12/14/2010, 5:47 PM

## 2010-12-14 NOTE — Progress Notes (Addendum)
Pt was assessed by CSW Dori on 12/13/2010 and psyc and psyhosocial assessment was completed (pl see shadow chart).Per the assessment note the pt can return to [facility] at discharge. CSW will continue to follow the pt and offer support.Luane School, LCSWA 12/14/2010 3:21 PM

## 2010-12-15 LAB — CBC
HCT: 30 % — ABNORMAL LOW (ref 39.0–52.0)
Hemoglobin: 9.6 g/dL — ABNORMAL LOW (ref 13.0–17.0)
RDW: 13.4 % (ref 11.5–15.5)
WBC: 5.8 10*3/uL (ref 4.0–10.5)

## 2010-12-15 LAB — BASIC METABOLIC PANEL
Chloride: 101 mEq/L (ref 96–112)
GFR calc Af Amer: 49 mL/min — ABNORMAL LOW (ref >90)
Potassium: 4.6 mEq/L (ref 3.5–5.1)

## 2010-12-15 LAB — GLUCOSE, CAPILLARY: Glucose-Capillary: 159 mg/dL — ABNORMAL HIGH (ref 70–99)

## 2010-12-15 MED ORDER — METOLAZONE 2.5 MG PO TABS
2.5000 mg | ORAL_TABLET | Freq: Two times a day (BID) | ORAL | Status: DC
  Administered 2010-12-15 – 2010-12-16 (×2): 2.5 mg via ORAL
  Filled 2010-12-15 (×3): qty 1

## 2010-12-15 NOTE — Progress Notes (Signed)
Subjective: Decrease swelling. No dyspnea   Physical Exam: Blood pressure 189/72, pulse 73, temperature 98.2 F (36.8 C), temperature source Oral, resp. rate 18, height 5\' 7"  (1.702 m), weight 113.853 kg (251 lb), SpO2 98.00%. Alert and oriented x3 CVS: RRR, S3 gallop RS: diminished bilateral crackles Abdomen soft NT LE +1 edema  I/O last 3 completed shifts: In: 840 [P.O.:840] Out: 7650 [Urine:7650] Total I/O In: [id number] [P.O.:642; I.V.:3; IV Piggyback:16] Out: 2550 [Urine:2550] Weight change: -5.897 kg (-13 lb)   Basic Metabolic Panel:  Basename 12/15/10 0545 12/14/10 0630 12/12/10 2105  NA 139 135 --  K 4.6 4.1 --  CL 101 102 --  CO2 31 28 --  GLUCOSE 108* 79 --  BUN 29* 28* --  CREATININE 1.81* 1.89* --  CALCIUM 9.2 9.0 --  MG -- -- 2.1  PHOS -- -- --    CBC:  Basename 12/15/10 0545 12/14/10 0630  WBC 5.8 6.6  NEUTROABS -- --  HGB 9.6* 9.2*  HCT 30.0* 28.9*  MCV 90.4 91.7  PLT 252 217    No results found.    Medications:  Scheduled:    . ARIPiprazole  5 mg Oral Daily  . aspirin EC  81 mg Oral Daily  . baclofen  20 mg Oral BID  . buPROPion  150 mg Oral BID  . carvedilol  3.125 mg Oral BID WC  . citalopram  20 mg Oral Daily  . enoxaparin  40 mg Subcutaneous Q24H  . furosemide  80 mg Intravenous Q6H  . hydrALAZINE  100 mg Oral Q8H  . insulin aspart  0-20 Units Subcutaneous TID WC  . insulin aspart  0-5 Units Subcutaneous QHS  . insulin aspart  6 Units Subcutaneous TID WC  . insulin detemir  20 Units Subcutaneous Daily  . isosorbide mononitrate  120 mg Oral Daily  . metolazone  2.5 mg Oral BID  . pantoprazole  40 mg Oral BID  . perphenazine  2 mg Oral QHS  . rosuvastatin  40 mg Oral QHS  . sodium chloride  3 mL Intravenous Q12H  . timolol  1 drop Both Eyes BID   2d Echo still pending   Impression:  Principal Problem:  *CHF (congestive heart failure) Active Problems:  HTN (hypertension)  Diabetes type 2, controlled  Anemia associated  with chronic renal failure  CRF (chronic renal failure)  Hypercholesterolemia  Alcohol dependence  Hypoglycemia  ARF (acute renal failure)     Plan: Continue to diurese Hold ACEI F/U creatinine      LOS: 3 days   Diera Wirkkala, MD Pager: [phone] 12/15/2010, 5:09 PM

## 2010-12-15 NOTE — Progress Notes (Signed)
RN ambulated with pt in hallway from room to end of hallway and back to room. Pt tolerated well- no complaints of shortness of breath, dizziness, or lightheadedness. Will continue to monitor.

## 2010-12-15 NOTE — Progress Notes (Signed)
Pt BP 189/72. Will administer scheduled hydralazine and lasix and recheck BP.

## 2010-12-15 NOTE — Progress Notes (Signed)
Clinical Social Work, 12/15/10, 1500:  CSW spoke with [facility] who states they will hold patient's bed for tomorrow if he medically ready for discharge.  They state that they can provide transportation upon discharge and that since they do not have a pharmacy at [facility] patient will need scripts to get his medication from the pharmacy when discharge.[place], [place], [place], [place]

## 2010-12-16 DIAGNOSIS — I5033 Acute on chronic diastolic (congestive) heart failure: Secondary | ICD-10-CM | POA: Diagnosis present

## 2010-12-16 LAB — BASIC METABOLIC PANEL
BUN: 32 mg/dL — ABNORMAL HIGH (ref 6–23)
CO2: 32 mEq/L (ref 19–32)
Chloride: 97 mEq/L (ref 96–112)
Creatinine, Ser: 2.17 mg/dL — ABNORMAL HIGH (ref 0.50–1.35)
GFR calc Af Amer: 39 mL/min — ABNORMAL LOW (ref >90)
GFR calc non Af Amer: 34 mL/min — ABNORMAL LOW (ref >90)
Glucose, Bld: 155 mg/dL — ABNORMAL HIGH (ref 70–99)
Potassium: 3.6 mEq/L (ref 3.5–5.1)

## 2010-12-16 LAB — GLUCOSE, CAPILLARY
Glucose-Capillary: 150 mg/dL — ABNORMAL HIGH (ref 70–99)
Glucose-Capillary: 101 mg/dL — ABNORMAL HIGH (ref 70–99)

## 2010-12-16 LAB — CBC
HCT: 32.5 % — ABNORMAL LOW (ref 39.0–52.0)
MCHC: 32.9 g/dL (ref 30.0–36.0)
RDW: 13.2 % (ref 11.5–15.5)

## 2010-12-16 MED ORDER — CARVEDILOL 3.125 MG PO TABS: 3.1250 mg | ORAL_TABLET | Freq: Two times a day (BID) | ORAL | Status: DC

## 2010-12-16 MED ORDER — TORSEMIDE 20 MG PO TABS: 40.0000 mg | ORAL_TABLET | Freq: Two times a day (BID) | ORAL | Status: DC

## 2010-12-16 MED ORDER — INSULIN DETEMIR 100 UNIT/ML ~~LOC~~ SOLN: 20.0000 [IU] | Freq: Every day | SUBCUTANEOUS | Status: DC

## 2010-12-16 NOTE — Discharge Summary (Signed)
Physician Discharge Summary  Patient ID: Jon Russell MRN: [id number] DOB/AGE: 04-14-1960 50 y.o. Primary Care Physician:MARTIN, MELANIE, MD Admit date: 12/12/2010 Discharge date: 12/16/2010    Discharge Diagnoses:  Acute on chronic diastolic congestive heart failure  HTN (hypertension)  Diabetes type 2, controlled  Anemia associated with chronic renal failure  CRF (chronic renal failure) stage III  Hypercholesterolemia  Alcohol dependence currently undergoing inpatient detoxification  Hypoglycemia: resolved    Discharge Medication List as of 12/16/2010  1:37 PM    START taking these medications   Details  carvedilol (COREG) 3.125 MG tablet Take 1 tablet (3.125 mg total) by mouth 2 (two) times daily with a meal., Starting 12/16/2010, Until Wed 12/16/11, Print      CONTINUE these medications which have CHANGED   Details  insulin detemir (LEVEMIR) 100 UNIT/ML injection Inject 20 Units into the skin daily., Starting 12/16/2010, Until Discontinued, No Print    torsemide (DEMADEX) 20 MG tablet Take 2 tablets (40 mg total) by mouth 2 (two) times daily., Starting 12/16/2010, Until Discontinued, Print      CONTINUE these medications which have NOT CHANGED   Details  ARIPiprazole (ABILIFY) 5 MG tablet Take 5 mg by mouth daily.  , Until Discontinued, Historical Med    aspirin 81 MG EC tablet Take 81 mg by mouth daily. , Until Discontinued, Historical Med    atorvastatin (LIPITOR) 40 MG tablet Take 40 mg by mouth daily.  , Until Discontinued, Historical Med    baclofen (LIORESAL) 20 MG tablet Take 20 mg by mouth 2 (two) times daily.  , Until Discontinued, Historical Med    buPROPion (WELLBUTRIN SR) 150 MG 12 hr tablet Take 150 mg by mouth 2 (two) times daily.  , Until Discontinued, Historical Med    citalopram (CELEXA) 20 MG tablet Take 20 mg by mouth daily.  , Until Discontinued, Historical Med    hydrALAZINE (APRESOLINE) 100 MG tablet Take 100 mg by mouth 3 (three) times daily. ,  Until Discontinued, Historical Med    insulin aspart (NOVOLOG) 100 UNIT/ML injection Inject 8 Units into the skin 3 (three) times daily before meals. Sliding scale, Until Discontinued, Historical Med    isosorbide mononitrate (IMDUR) 60 MG 24 hr tablet Take 120 mg by mouth daily.  , Until Discontinued, Historical Med    methylcellulose (ARTIFICIAL TEARS) 1 % ophthalmic solution 1 drop as needed. For dry eye , Until Discontinued, Historical Med    pantoprazole (PROTONIX) 40 MG tablet Take 40 mg by mouth 2 (two) times daily.  , Until Discontinued, Historical Med    perphenazine (TRILAFON) 2 MG tablet Take 2 mg by mouth at bedtime.  , Until Discontinued, Historical Med    timolol (BETIMOL) 0.25 % ophthalmic solution Place 1 drop into both eyes 2 (two) times daily as needed.  , Until Discontinued, Historical Med    albuterol (PROVENTIL) (2.5 MG/3ML) 0.083% nebulizer solution Inhale 2.5 mg into the lungs every 4 (four) hours as needed.  , Until Discontinued, Historical Med      STOP taking these medications     ibuprofen (ADVIL,MOTRIN) 200 MG tablet      NIFEdipine (PROCARDIA XL/ADALAT-CC) 60 MG 24 hr tablet         Discharged Condition: Good    Consults: None  Significant Diagnostic Studies: X-ray Chest Pa And Lateral  12/13/2010  *RADIOLOGY REPORT*  Clinical Data: Congestive heart failure  CHEST - 2 VIEW  Comparison: Chest radiograph 12/12/2010  Findings: Stable enlarged heart silhouette.  There is perihilar air space disease which is slightly increased compared to prior.  There is small bilateral pleural effusions.  No pneumothorax.  No osseous abnormality.  IMPRESSION:  Findings consistent with mild worsening of congestive heart failure.  Original Report Authenticated By: Suzy Bouchard, M.D.   Dg Chest 2 View  12/12/2010  *RADIOLOGY REPORT*  Clinical Data: Shortness of breath, COPD, smoking history  CHEST - 2 VIEW  Comparison: Portable chest x-ray of 07/22/2010  Findings:  There is cardiomegaly present with mild pulmonary vascular congestion and small effusions most consistent with mild congestive heart failure.  No focal abnormality is seen.  No bony abnormality is noted.  IMPRESSION: Probable CHF with cardiomegaly, pulmonary vascular congestion, and small effusions.  Original Report Authenticated By: Joretta Bachelor, M.D.    Lab Results: Basic Metabolic Panel:  Basename 12/16/10 0518 12/15/10 0545  NA 137 139  K 3.6 4.6  CL 97 101  CO2 32 31  GLUCOSE 155* 108*  BUN 32* 29*  CREATININE 2.17* 1.81*  CALCIUM 9.4 9.2  MG -- --  PHOS -- --   Liver Function Tests: No results found for this basename: AST:2,ALT:2,ALKPHOS:2,BILITOT:2,PROT:2,ALBUMIN:2 in the last 72 hours   CBC:  Basename 12/16/10 0518 12/15/10 0545  WBC 5.9 5.8  NEUTROABS -- --  HGB 10.7* 9.6*  HCT 32.5* 30.0*  MCV 89.5 90.4  PLT 259 252   2D echo Study Conclusions  - Left ventricle: The cavity size was normal. There was moderate concentric hypertrophy. Systolic function was normal. The estimated ejection fraction was in the range of 55% to 60%. Wall motion was normal; there were no regional wall motion abnormalities. Features are consistent with a pseudonormal left ventricular filling pattern, with concomitant abnormal relaxation and increased filling pressure (grade 2 diastolic dysfunction). Doppler parameters are consistent with both elevated ventricular end-diastolic filling pressure and elevated left atrial filling pressure. - Atrial septum: No defect or patent foramen ovale was identified.    Hospital Course:  Jon Russell is a 50 year old gentleman with known chronic disease, diabetes, hypertension who presented to the emergency room on December 12, 2010 with complaints of increasing dyspnea and edema in his lower extremities. The patient was in the middle of the alcohol rehabilitation program at [facility]. He was admitted and started on aggressive intravenous diuresis.  During this admission the patient lost 32 pounds. He felt significantly better by the time of discharge. Thorough education about the importance of fluid restriction and salt restriction was given. The only other complication during this admission were some episodes of hypoglycemia which we corrected after we decreased the dose of Levemir.  Discharge Exam: Blood pressure 166/81, pulse 77, temperature 98.4 F (36.9 C), temperature source Oral, resp. rate 18, height 5\' 7"  (1.702 m), weight 108.8 kg (239 lb 13.8 oz), SpO2 95.00%. Alert oriented x3 Chest clear to auscultation a week without wheezes rhonchi or crackles Heart regular rate and rhythm without murmur  LE trace edema Disposition: [facility] to [facility]  Discharge Orders    Future Appointments: Provider: Department: Dept Phone: Center:   12/22/2010 2:00 PM [facility] [phone] None     Future Orders Please Complete By Expires   Diet - low sodium heart healthy      Increase activity slowly      (HEART FAILURE PATIENTS) Call MD:  Anytime you have any of the following symptoms: 1) 3 pound weight gain in 24 hours or 5 pounds in 1 week 2) shortness of breath, with  or without a dry hacking cough 3) swelling in the hands, feet or stomach 4) if you have to sleep on extra pillows at night in order to breathe.         Follow-up Information    Follow up with Glori Bickers, MD. ( 12/22/10 at 2 pm  )    Contact information:   [facility] [phone]          Signed: Edythe Lynn 12/16/2010, 5:40 PM

## 2010-12-16 NOTE — Progress Notes (Signed)
Utilization review complete 

## 2010-12-16 NOTE — Progress Notes (Signed)
Pt's tele d/c; IV d/c. Pt verbalizes understanding of discharge instructions and medications.  Pt discharged to [facility]_____________________________________________________________________________D. Owens Shark RN

## 2010-12-22 ENCOUNTER — Ambulatory Visit (HOSPITAL_COMMUNITY)
Admit: 2010-12-22 | Discharge: 2010-12-22 | Disposition: A | Discharge status: Home / Self Care | Payer: Medicare Other | Source: Ambulatory Visit | Attending: Internal Medicine | Admitting: Internal Medicine

## 2010-12-22 VITALS — BP 182/84 | HR 78 | Wt 257.0 lb

## 2010-12-22 DIAGNOSIS — I509 Heart failure, unspecified: Secondary | ICD-10-CM

## 2010-12-22 DIAGNOSIS — I5033 Acute on chronic diastolic (congestive) heart failure: Secondary | ICD-10-CM | POA: Insufficient documentation

## 2010-12-22 DIAGNOSIS — I5032 Chronic diastolic (congestive) heart failure: Secondary | ICD-10-CM | POA: Insufficient documentation

## 2010-12-22 DIAGNOSIS — I10 Essential (primary) hypertension: Secondary | ICD-10-CM | POA: Insufficient documentation

## 2010-12-22 MED ORDER — TORSEMIDE 20 MG PO TABS: 40.0000 mg | ORAL_TABLET | Freq: Two times a day (BID) | ORAL | Status: DC

## 2010-12-22 MED ORDER — CARVEDILOL 12.5 MG PO TABS: 12.5000 mg | ORAL_TABLET | Freq: Two times a day (BID) | ORAL | Status: DC

## 2010-12-22 NOTE — Assessment & Plan Note (Signed)
BP markedly elevated. Will add carvedilol 12.5 bid.

## 2010-12-22 NOTE — Progress Notes (Signed)
Referring Physician: Dr. Marye Round PCP: Rolanda Lundborg NP ([facility])   HPI: Jon Russell is a 50 y/o male with multiple medical problems including obesity, HTN, DM2, COPD (with ongoing tobacco use), ETOH abuse, CRI (baseline Cr 1.8-2.0), OSA on CPAP and legal blindness (due to diabetic retinopathy).  Denies h/o known CAD. [name] he saw a cardiologist in [facility] some years back for CHF but has never had a cath. Had stress test at [facility] last year for CP. States stress test was OK.   He was recently admitted to [facility] for acute diastolic HF while attending ETOH detox program at [facility]. Echo EF 55-60% with pseudonormal filling pattern. Diuresed and discharged back to [facility]. Weight at d/c was 239 pounds. Subsequently was "kicked out" of [facility]. Now presents to establish care in the HF clinic.  Says weight now back up to 257. DOE at 40 feet. No CP. Sleeps on 3 pillows. + CPAP. No orthopnea or PND. No CP. Says he is taking all his medications. Not watching his diet closely.  Smoking 3-4 cigs/day. No ETOH or drugs since last week.  Currently reports taking demadex 20 bid. Was supposed to switch Procardia to carvedilol but hasn't done this yet.   Review of Systems:     Cardiac Review of Systems: {Y] = yes [ ]  = no  Chest Pain [    ]  Resting SOB [   ] Exertional SOB  [ Y ]  Orthopnea [  ]   Pedal Edema [   ]    Palpitations [  ] Syncope  [  ]   Presyncope [   ]  General Review of Systems: [Y] = yes [  ]=no Constitional: recent weight change [  ]; anorexia [  ]; fatigue [ Y ]; nausea [  ]; night sweats [  ]; fever [  ]; or chills [  ];                                                                                                                                          Dental: poor dentition[  ];   Eye : blurred vision [  ]; diplopia [   ]; vision changes [  ];  Amaurosis fugax[  ]; Resp: cough [  ];  wheezing[  ];  hemoptysis[  ]; shortness of breath[ Y ]; paroxysmal nocturnal dyspnea[   ]; dyspnea on exertion[ Y]; or orthopnea[  ];  GI:  gallstones[  ], vomiting[  ];  dysphagia[  ]; melena[  ];  hematochezia [  ]; heartburn[  ];    GU: kidney stones [  ]; hematuria[  ];   dysuria [  ];  nocturia[  ];  history of    obstruction [  ];                 Skin: rash, swelling[  ];,  hair loss[  ];  peripheral edema[  ];  or itching[  ]; Musculosketetal: myalgias[  ];  joint swelling[  ];  joint erythema[  ];  joint pain[  ];  back pain[  ];  Heme/Lymph: bruising[  ];  bleeding[  ];  anemia[  ];  Neuro: TIA[  ];  headaches[  ];  stroke[  ];  vertigo[  ];  seizures[  ];   paresthesias[  ];  difficulty walking[  ];  Psych:depression[  ]; anxiety[  ];  Endocrine: diabetes[  ];  thyroid dysfunction[  ];  Immunizations: Flu [  ]; Pneumococcal[  ];  Other:  Past Medical History  Diagnosis Date  . CHF (congestive heart failure)   . COPD (chronic obstructive pulmonary disease)   . Coronary artery disease   . Diabetes mellitus   . Hypertension   . Back pain   . Hyperlipemia   . Renal insufficiency      No Known Allergies  History   Social History  . Marital Status: Single    Spouse Name: N/A    Number of Children: N/A  . Years of Education: N/A   Occupational History  . Not on file.   Social History Main Topics  . Smoking status: Current Everyday Smoker -- 0.5 packs/day    Types: Cigarettes  . Smokeless tobacco: Never Used  . Alcohol Use: Yes     states he drinks 1/5 of liquor dailt- last drink 30 days ago  . Drug Use: Yes    Special: Cocaine     states last used 30 days ago  . Sexually Active:    Other Topics Concern  . Not on file   Social History Narrative  . No narrative on file    No family history on file.  PHYSICAL EXAM: Filed Vitals:   12/22/10 1427  BP: 184/98  Pulse: 78   General:  Well appearing. No respiratory difficulty HEENT: normal Neck: supple. Thick. JVP 8-10. Carotids 2+ bilat; no bruits. No lymphadenopathy or thryomegaly  appreciated. Cor: PMI nondisplaced. Regular rate & rhythm. No rubs, gallops or murmurs. Lungs: clear Abdomen: soft, nontender, nondistended. No hepatosplenomegaly. No bruits or masses. Good bowel sounds. Extremities: no cyanosis, clubbing, rash, 2-3+ firm LE edema Neuro: alert & oriented x 3, cranial nerves grossly intact. moves all 4 extremities w/o difficulty. Affect pleasant.

## 2010-12-22 NOTE — Assessment & Plan Note (Addendum)
He is markedly volume overloaded. Will increase demadex to 40 bid and give him several doses of metolazone. Given financial constraints may need to switch over to lasix down the road. Extensive discussion on need for dietary restriction and importance of medication compliance and BP control. Seen by SW today who is working on temporary SNF placement. Refuses long-term assisted living placement. Continue CPAP.    Total MD time spent = 45 mins with over 70% of that time dedicated to counseling and discussions described above.

## 2010-12-22 NOTE — Patient Instructions (Addendum)
Torsemide (Demadex) 20mg  take 2 tablets twice daily - morning and afternoon Carvedilol (Coreg) 12.5mg  twice a day  - may make you feel a little tired for the first few days - keep taking it  Weigh every morning  Your physician recommends that you schedule a follow-up appointment in: 1 week

## 2010-12-29 ENCOUNTER — Encounter (HOSPITAL_COMMUNITY): Payer: Self-pay

## 2010-12-29 ENCOUNTER — Ambulatory Visit (HOSPITAL_COMMUNITY)
Admission: RE | Admit: 2010-12-29 | Discharge: 2010-12-29 | Disposition: A | Discharge status: Home / Self Care | Payer: Medicare Other | Source: Ambulatory Visit | Attending: Internal Medicine | Admitting: Internal Medicine

## 2010-12-29 VITALS — BP 170/84 | HR 70 | Wt 253.2 lb

## 2010-12-29 DIAGNOSIS — Z760 Encounter for issue of repeat prescription: Secondary | ICD-10-CM

## 2010-12-29 DIAGNOSIS — Z79899 Other long term (current) drug therapy: Secondary | ICD-10-CM | POA: Insufficient documentation

## 2010-12-29 DIAGNOSIS — I5022 Chronic systolic (congestive) heart failure: Secondary | ICD-10-CM | POA: Insufficient documentation

## 2010-12-29 DIAGNOSIS — I509 Heart failure, unspecified: Secondary | ICD-10-CM

## 2010-12-29 DIAGNOSIS — I5032 Chronic diastolic (congestive) heart failure: Secondary | ICD-10-CM

## 2010-12-29 DIAGNOSIS — I10 Essential (primary) hypertension: Secondary | ICD-10-CM | POA: Insufficient documentation

## 2010-12-29 DIAGNOSIS — F1020 Alcohol dependence, uncomplicated: Secondary | ICD-10-CM | POA: Insufficient documentation

## 2010-12-29 LAB — BASIC METABOLIC PANEL
BUN: 52 mg/dL — ABNORMAL HIGH (ref 6–23)
Chloride: 92 mEq/L — ABNORMAL LOW (ref 96–112)
GFR calc Af Amer: 28 mL/min — ABNORMAL LOW (ref >90)
Potassium: 4.5 mEq/L (ref 3.5–5.1)
Sodium: 131 mEq/L — ABNORMAL LOW (ref 135–145)

## 2010-12-29 MED ORDER — CARVEDILOL 25 MG PO TABS: 25.0000 mg | ORAL_TABLET | Freq: Two times a day (BID) | ORAL | Status: DC

## 2010-12-29 MED ORDER — PANTOPRAZOLE SODIUM 40 MG PO TBEC: 40.0000 mg | DELAYED_RELEASE_TABLET | Freq: Two times a day (BID) | ORAL | Status: DC

## 2010-12-29 MED ORDER — ARIPIPRAZOLE 5 MG PO TABS: 5.0000 mg | ORAL_TABLET | Freq: Every day | ORAL | Status: DC

## 2010-12-29 MED ORDER — HYDRALAZINE HCL 100 MG PO TABS: 100.0000 mg | ORAL_TABLET | Freq: Three times a day (TID) | ORAL | Status: DC

## 2010-12-29 MED ORDER — PERPHENAZINE 2 MG PO TABS: 2.0000 mg | ORAL_TABLET | Freq: Every day | ORAL | Status: DC

## 2010-12-29 MED ORDER — ATORVASTATIN CALCIUM 40 MG PO TABS: 40.0000 mg | ORAL_TABLET | Freq: Every day | ORAL | Status: DC

## 2010-12-29 MED ORDER — NIFEDIPINE ER OSMOTIC RELEASE 60 MG PO TB24: 60.0000 mg | ORAL_TABLET | Freq: Every day | ORAL | Status: DC

## 2010-12-29 MED ORDER — DIPHENHYDRAMINE HCL 25 MG PO TABS: 25.0000 mg | ORAL_TABLET | Freq: Every evening | ORAL | Status: DC | PRN

## 2010-12-29 NOTE — Assessment & Plan Note (Signed)
He has multiple meds in many bottles will ask pharmacy to see him at next visit.

## 2010-12-29 NOTE — Assessment & Plan Note (Signed)
Volume status much improved. But probably still about 10 pounds to go. Continue current regimen. Check BMET to go.

## 2010-12-29 NOTE — Progress Notes (Signed)
Referring Physician: Dr. Marye Round PCP: Rolanda Lundborg NP ([facility])   HPI: Jon Russell is a 50 y/o male with multiple medical problems including obesity, HTN, DM2, COPD (with ongoing tobacco use), ETOH abuse, CRI (baseline Cr 1.8-2.0), OSA on CPAP and legal blindness (due to diabetic retinopathy).  Denies h/o known CAD. [name] he saw a cardiologist in [facility] some years back for CHF but has never had a cath. Had stress test at [facility] last year for CP. States stress test was OK.   He was recently admitted to [facility] for acute diastolic HF while attending ETOH detox program at [facility]. Echo EF 55-60% with pseudonormal filling pattern. Diuresed and discharged back to [facility]. Weight at d/c was 239 pounds. Subsequently was "kicked out" of [facility]. We saw him for the first time in HF clinic last week. Weight back up to 257. BP 184/98. We increased demadex to 40 bid and gave him several days of metolazone. Coreg increased to 12.5 bid. Suggested SNF but he refused. Seen by SW.    Here for f/u. Feeling better. Down about 5 pounds. Breathing much better. Edema down. No CP. Brings with him 3 big bags of pill bottles and says his wife puts them in pill dispenser for him. Wife also making sure he follows a better diet. Not touching alcohol b/c he says wife won't let him out the house.  Past Medical History  Diagnosis Date  . CHF (congestive heart failure)   . COPD (chronic obstructive pulmonary disease)   . Coronary artery disease   . Diabetes mellitus   . Hypertension   . Back pain   . Hyperlipemia   . Renal insufficiency      No Known Allergies  History   Social History  . Marital Status: Single    Spouse Name: N/A    Number of Children: N/A  . Years of Education: N/A   Occupational History  . Not on file.   Social History Main Topics  . Smoking status: Current Everyday Smoker -- 0.5 packs/day    Types: Cigarettes  . Smokeless tobacco: Never Used  . Alcohol Use: Yes   states he drinks 1/5 of liquor dailt- last drink 30 days ago  . Drug Use: Yes    Special: Cocaine     states last used 30 days ago  . Sexually Active:    Other Topics Concern  . Not on file   Social History Narrative  . No narrative on file    No family history on file.  PHYSICAL EXAM: Filed Vitals:   12/29/10 1255  BP: 170/84  Pulse: 70   General:  Well appearing. No respiratory difficulty HEENT: normal Neck: supple. Thick. JVP 6-7. Carotids 2+ bilat; no bruits. No lymphadenopathy or thryomegaly appreciated. Cor: PMI nondisplaced. Regular rate & rhythm. No rubs, gallops or murmurs. Lungs: clear Abdomen: soft, nontender, nondistended. No hepatosplenomegaly. No bruits or masses. Good bowel sounds. Extremities: no cyanosis, clubbing, rash, 1-2+  LE edema Neuro: alert & oriented x 3, cranial nerves grossly intact. moves all 4 extremities w/o difficulty. Affect pleasant.

## 2010-12-29 NOTE — Assessment & Plan Note (Signed)
BP improved but still high. Will increase carvedilol to 25 bid.

## 2010-12-29 NOTE — Patient Instructions (Signed)
Increase Carvedilol to 25 mg Twice daily   Your physician recommends that you schedule a follow-up appointment in: 3 weeks (needs an AM appointment)

## 2010-12-29 NOTE — Assessment & Plan Note (Signed)
Working with SW to try and get placement. Unless he has supervision, I worry that he will do much worse without his wife's close supervision.

## 2011-01-01 ENCOUNTER — Telehealth (HOSPITAL_COMMUNITY): Payer: Self-pay | Admitting: *Deleted

## 2011-01-01 DIAGNOSIS — I5022 Chronic systolic (congestive) heart failure: Secondary | ICD-10-CM

## 2011-01-01 NOTE — Telephone Encounter (Signed)
Left message to call back  

## 2011-01-01 NOTE — Telephone Encounter (Signed)
Jon Russell called today returning your call regarding his recent tests, he would like a call back. Thanks.

## 2011-01-02 NOTE — Telephone Encounter (Signed)
Spoke w/pt regarding lab results, he will go to [place] on Mon for repeat labs

## 2011-01-05 ENCOUNTER — Other Ambulatory Visit (INDEPENDENT_AMBULATORY_CARE_PROVIDER_SITE_OTHER): Payer: Medicare Other | Admitting: *Deleted

## 2011-01-05 DIAGNOSIS — I5022 Chronic systolic (congestive) heart failure: Secondary | ICD-10-CM

## 2011-01-05 LAB — BASIC METABOLIC PANEL
CO2: 31 mEq/L (ref 19–32)
Chloride: 94 mEq/L — ABNORMAL LOW (ref 96–112)
Creatinine, Ser: 3.3 mg/dL — ABNORMAL HIGH (ref 0.4–1.5)
Potassium: 4.5 mEq/L (ref 3.5–5.1)
Sodium: 134 mEq/L — ABNORMAL LOW (ref 135–145)

## 2011-01-09 ENCOUNTER — Telehealth (HOSPITAL_COMMUNITY): Payer: Self-pay | Admitting: *Deleted

## 2011-01-09 NOTE — Telephone Encounter (Signed)
Yonas, Hovde' wife called.  Wanted to let you know Zohar received the torsemide. Frl/01/09/11

## 2011-01-19 ENCOUNTER — Ambulatory Visit (HOSPITAL_COMMUNITY): Payer: Medicare Other

## 2011-01-21 NOTE — Telephone Encounter (Signed)
Ok great, will f/u at next appt 1/21

## 2011-02-02 ENCOUNTER — Ambulatory Visit (HOSPITAL_COMMUNITY)
Admission: RE | Admit: 2011-02-02 | Discharge: 2011-02-02 | Disposition: A | Discharge status: Home / Self Care | Payer: Medicare Other | Source: Ambulatory Visit | Attending: Internal Medicine | Admitting: Internal Medicine

## 2011-02-02 VITALS — BP 138/64 | HR 67 | Wt 276.0 lb

## 2011-02-02 DIAGNOSIS — I509 Heart failure, unspecified: Secondary | ICD-10-CM

## 2011-02-02 DIAGNOSIS — N189 Chronic kidney disease, unspecified: Secondary | ICD-10-CM | POA: Insufficient documentation

## 2011-02-02 DIAGNOSIS — I5032 Chronic diastolic (congestive) heart failure: Secondary | ICD-10-CM

## 2011-02-02 DIAGNOSIS — I5043 Acute on chronic combined systolic (congestive) and diastolic (congestive) heart failure: Secondary | ICD-10-CM | POA: Insufficient documentation

## 2011-02-02 DIAGNOSIS — I5033 Acute on chronic diastolic (congestive) heart failure: Secondary | ICD-10-CM

## 2011-02-02 LAB — COMPREHENSIVE METABOLIC PANEL
ALT: 42 U/L (ref 0–53)
Calcium: 9.9 mg/dL (ref 8.4–10.5)
Creatinine, Ser: 2.35 mg/dL — ABNORMAL HIGH (ref 0.50–1.35)
GFR calc Af Amer: 36 mL/min — ABNORMAL LOW (ref >90)
Glucose, Bld: 74 mg/dL (ref 70–99)
Sodium: 143 mEq/L (ref 135–145)
Total Protein: 6.6 g/dL (ref 6.0–8.3)

## 2011-02-02 MED ORDER — METOLAZONE 2.5 MG PO TABS: 2.5000 mg | ORAL_TABLET | ORAL | Status: DC | PRN

## 2011-02-02 MED ORDER — TORSEMIDE 20 MG PO TABS: 40.0000 mg | ORAL_TABLET | Freq: Two times a day (BID) | ORAL | Status: DC

## 2011-02-02 NOTE — Patient Instructions (Signed)
Increase torsemide 40 mg (2 tabs) twice daily.  Start taking metolazone 2.5 mg daily for 3 days.    Call the clinic on Thursday for updates on weight.  Follow up 1 week.    Will have referral to [facility].  Labs today.    Do the following things EVERYDAY: 1) Weigh yourself in the morning before breakfast. Write it down and keep it in a log. 2) Take your medicines as prescribed 3) Eat low salt foods-Limit salt (sodium) to 2000mg  per day.  4) Stay as active as you can everyday 5) Watch ALL fluid intake (less than 2 liters daily)

## 2011-02-02 NOTE — Assessment & Plan Note (Signed)
He has significant fluid overload in the setting of dietary noncompliance. We discussed possible hospitalization versus a trial of his outpatient regimen. We will attempt the latter. Increase demadex to 40 bid and add metolazone 2.5 daily for the next few days. We will check back with him midweek. If not improving will admit him. Long talk about need for dietary compliance going forward. Will need to watch renal function closely.

## 2011-02-02 NOTE — Progress Notes (Signed)
Patient ID: Jon Russell, male   DOB: September 23, 1960, 51 y.o.   MRN: [id number]  Heart Failure Education:  I spoke to patient and his wife about his heart failure.  They were given the "Living Better with Heart Failure" booklet and Zone tool.   Patient currently only weighs himself sporadically. He has a scale that reads his weight to him, since he can't see the numbers.  Not currently recording weights.  I advised that he weigh himself daily, same time of day, on the same scale.  Also asked that he record these weights, and call the HF clinic  if his weight increases by more than 3 lbs in 1 day, or by more than 5 lbs in a week.  We discussed the Zone tool, and the symptoms that corresponded with each Zone.  Advised to call the HF clinic when his symptoms were in the Yellow zone, and to call 911 or go to ER if he was having red zone symptoms.  Also reviewed the necessity of a low salt diet.  Discussed how to read food labels, making low-salt food choices and totaling the amount of salt in food and beverages.  Patient is currently enrolled in [facility] program, and plans to start exercising with this program.  Reinforced need to do this and to get physical activity in an environment where he can rest when needed.  Patient currently reports smoking 3-4 cigarettes daily.  Encouraged patient to quit.  He said he plans  to quit cold [place] by his next HF visit.  Patient and wife verbalized understanding and questions were answered.  [place] Crane, Pharm D 02/02/2011 10:30 AM

## 2011-02-02 NOTE — Progress Notes (Signed)
PCP: Rolanda Lundborg NP ([facility])   HPI: Jon Russell is a 51 y/o male with multiple medical problems including obesity, HTN, DM2, COPD (with ongoing tobacco use), ETOH abuse, CRI (baseline Cr 1.8-2.0), OSA on CPAP and legal blindness (due to diabetic retinopathy).  Ischemic work up in th past included a stress test at [facility] for CP that was reportedly ok.    Admitted to [facility] [id number] with diastolic HF while attending ETOH detox program at [facility]. Echo EF 55-60% with pseudonormal filling pattern. Weight at d/c was 239 pounds.  His was felt to high risk from a medical standpoint because his weight kept going up so he was discharged from [facility].  He is living with his wife.    Last visit carvedilol increased to 25 mg BID.  He returns for routine follow up today.  Fluid tabled decreased last visit due to rise in creatinine.  His weight has been going up since that time.  He is orthopneic if he doesn't have his CPAP on.  He has problems with going up stairs.  Short distance walks are ok.  He is not following his low sodium diet.  He has been drinking more fluids (>2L) per day.  He was hospitalized 1/13 for hypertensive urgency.  BP has improved.  No alcohol use.  Down to 3-4 cig/day.  No chest pain.     ROS: Pertinent positives and negatives as in HPI, otherwise negative.   Past Medical History  Diagnosis Date  . CHF (congestive heart failure)   . COPD (chronic obstructive pulmonary disease)   . Coronary artery disease   . Diabetes mellitus   . Hypertension   . Back pain   . Hyperlipemia   . Renal insufficiency    No Known Allergies  Current Outpatient Prescriptions  Medication Sig Dispense Refill  . ARIPiprazole (ABILIFY) 5 MG tablet Take 1 tablet (5 mg total) by mouth daily.  30 tablet  1  . atorvastatin (LIPITOR) 40 MG tablet Take 1 tablet (40 mg total) by mouth daily.  30 tablet  1  . baclofen (LIORESAL) 20 MG tablet Take 20 mg by mouth 2 (two) times daily.        Marland Kitchen  buPROPion (WELLBUTRIN SR) 150 MG 12 hr tablet Take 150 mg by mouth daily.       . carvedilol (COREG) 25 MG tablet Take 1 tablet (25 mg total) by mouth 2 (two) times daily with a meal.  60 tablet  6  . citalopram (CELEXA) 20 MG tablet Take 20 mg by mouth daily.        Marland Kitchen dextran 70-hypromellose (TEARS RENEWED) ophthalmic solution Place 1 drop into both eyes 2 (two) times daily.       . diphenhydrAMINE (BENADRYL) 25 MG tablet Take 1 tablet (25 mg total) by mouth at bedtime as needed.  30 tablet  1  . dorzolamide-timolol (COSOPT) 22.3-6.8 MG/ML ophthalmic solution 1 drop 2 (two) times daily.        . hydrALAZINE (APRESOLINE) 100 MG tablet Take 1 tablet (100 mg total) by mouth 3 (three) times daily.  90 tablet  3  . insulin aspart (NOVOLOG) 100 UNIT/ML injection Inject 8 Units into the skin 3 (three) times daily before meals. Sliding scale      . insulin detemir (LEVEMIR) 100 UNIT/ML injection Inject 30 Units into the skin 2 (two) times daily.      . isosorbide mononitrate (IMDUR) 60 MG 24 hr tablet Take 120 mg by  mouth daily.        Marland Kitchen NIFEdipine (PROCARDIA XL/ADALAT-CC) 60 MG 24 hr tablet Take 60 mg by mouth 2 (two) times daily.      . pantoprazole (PROTONIX) 40 MG tablet Take 1 tablet (40 mg total) by mouth 2 (two) times daily.  60 tablet  1  . perphenazine (TRILAFON) 2 MG tablet Take 1 tablet (2 mg total) by mouth at bedtime.  30 tablet  1  . torsemide (DEMADEX) 20 MG tablet Take 2 tablets (40 mg total) by mouth 2 (two) times daily.  120 tablet  6  . aspirin 81 MG EC tablet Take 81 mg by mouth daily.       . metolazone (ZAROXOLYN) 2.5 MG tablet Take 1 tablet (2.5 mg total) by mouth as needed.  15 tablet  3    PHYSICAL EXAM: Filed Vitals:   02/02/11 0921  BP: 138/64  Pulse: 67  Weight: 125.193 kg (276 lb)  SpO2: 97%   General:  Well appearing. No respiratory difficulty HEENT: normal Neck: supple. Thick. JVP 9-10. Carotids 2+ bilat; no bruits. No lymphadenopathy or thryomegaly  appreciated. Cor: PMI nondisplaced. Regular rate & rhythm. No rubs, gallops or murmurs. Lungs: clear Abdomen: Tight, nontender, distend. No hepatosplenomegaly. No bruits or masses. Good bowel sounds. Extremities: no cyanosis, clubbing, rash, 2+  LE edema, tight  Neuro: alert & oriented x 3, cranial nerves grossly intact. moves all 4 extremities w/o difficulty. Affect pleasant.  Assessment and Plan:

## 2011-02-05 ENCOUNTER — Telehealth (HOSPITAL_COMMUNITY): Payer: Self-pay | Admitting: *Deleted

## 2011-02-05 NOTE — Telephone Encounter (Signed)
Jon Russell called today to let you know that his weight is down to 261 from 274.  He stated that he was suppose to call today and that due to his weight loss that he might be put in the hospital.  Please call Jon Russell back.  Thanks!

## 2011-02-05 NOTE — Telephone Encounter (Signed)
Returned phone call to Jon Russell.  He is feeling better and down 13 lbs.  His lower extremity edema is improving.  Have instructed him to continue demadex 40 BID and stop metolazone.  At this time he does not need to come into the hospital as he is having adequate response to current regimen.  He will follow up in the clinic on Monday.  He is to call if his weight starts going back up prior to Monday.  He voiced understanding.

## 2011-02-09 ENCOUNTER — Ambulatory Visit (HOSPITAL_COMMUNITY)
Admission: RE | Admit: 2011-02-09 | Discharge: 2011-02-09 | Disposition: A | Discharge status: Home / Self Care | Payer: Medicare Other | Source: Ambulatory Visit | Attending: Internal Medicine | Admitting: Internal Medicine

## 2011-02-09 VITALS — BP 176/60 | HR 64 | Wt 267.5 lb

## 2011-02-09 DIAGNOSIS — I509 Heart failure, unspecified: Secondary | ICD-10-CM

## 2011-02-09 DIAGNOSIS — I5032 Chronic diastolic (congestive) heart failure: Secondary | ICD-10-CM

## 2011-02-09 DIAGNOSIS — I10 Essential (primary) hypertension: Secondary | ICD-10-CM

## 2011-02-09 LAB — BASIC METABOLIC PANEL
CO2: 29 mEq/L (ref 19–32)
Chloride: 102 mEq/L (ref 96–112)
Creatinine, Ser: 2.81 mg/dL — ABNORMAL HIGH (ref 0.50–1.35)
Potassium: 4.3 mEq/L (ref 3.5–5.1)
Sodium: 140 mEq/L (ref 135–145)

## 2011-02-09 NOTE — Assessment & Plan Note (Signed)
Doing well. Weight down 12 pounds. Will check BMET today. Continue demadex at 40 bid. Add metolazone 2.5mg  1x/week. Reinforced need for daily weights and reviewed use of sliding scale diuretics.

## 2011-02-09 NOTE — Assessment & Plan Note (Signed)
BP still elevated may need clonidine if remains up.

## 2011-02-09 NOTE — Progress Notes (Signed)
PCP: Rolanda Lundborg NP ([facility])   HPI: Kidus is a 51 y/o male with multiple medical problems including obesity, HTN, DM2, COPD (with ongoing tobacco use), ETOH abuse, CRI (baseline Cr 1.8-2.0), OSA on CPAP and legal blindness (due to diabetic retinopathy).  Ischemic work up in th past included a stress test at [facility] for CP that was reportedly ok.    Admitted to [facility] [id number] with diastolic HF while attending ETOH detox program at [facility]. Echo EF 55-60% with pseudonormal filling pattern. Weight at d/c was 239 pounds.  His was felt to high risk from a medical standpoint because his weight kept going up so he was discharged from [facility].  He is living with his wife.    He returns for follow up today.  Last week his weight was up 20 pounds, metolazone was added for 3 days and demadex increased.  He was down 13 pounds after 3 days of metolazone and was not brought in for IV diuresis.   Weight has not decreased much over the last 5 days.  Breathing improved and cough has decreased.  Edema also improved.  Working on decreasing fluid intake.  2 cigs/day.  No alcohol use.       ROS: Pertinent positives and negatives as in HPI, otherwise negative.   Past Medical History  Diagnosis Date  . CHF (congestive heart failure)   . COPD (chronic obstructive pulmonary disease)   . Coronary artery disease   . Diabetes mellitus   . Hypertension   . Back pain   . Hyperlipemia   . Renal insufficiency    No Known Allergies  Current Outpatient Prescriptions  Medication Sig Dispense Refill  . ARIPiprazole (ABILIFY) 5 MG tablet Take 1 tablet (5 mg total) by mouth daily.  30 tablet  1  . aspirin 81 MG EC tablet Take 81 mg by mouth daily.       Marland Kitchen atorvastatin (LIPITOR) 40 MG tablet Take 1 tablet (40 mg total) by mouth daily.  30 tablet  1  . baclofen (LIORESAL) 20 MG tablet Take 20 mg by mouth 2 (two) times daily.        Marland Kitchen buPROPion (WELLBUTRIN SR) 150 MG 12 hr tablet Take 150 mg by  mouth daily.       . carvedilol (COREG) 25 MG tablet Take 1 tablet (25 mg total) by mouth 2 (two) times daily with a meal.  60 tablet  6  . citalopram (CELEXA) 20 MG tablet Take 20 mg by mouth daily.        Marland Kitchen dextran 70-hypromellose (TEARS RENEWED) ophthalmic solution Place 1 drop into both eyes 2 (two) times daily.       . diphenhydrAMINE (BENADRYL) 25 MG tablet Take 1 tablet (25 mg total) by mouth at bedtime as needed.  30 tablet  1  . dorzolamide-timolol (COSOPT) 22.3-6.8 MG/ML ophthalmic solution 1 drop 2 (two) times daily.        . hydrALAZINE (APRESOLINE) 100 MG tablet Take 1 tablet (100 mg total) by mouth 3 (three) times daily.  90 tablet  3  . insulin aspart (NOVOLOG) 100 UNIT/ML injection Inject 8 Units into the skin 3 (three) times daily before meals. Sliding scale      . insulin detemir (LEVEMIR) 100 UNIT/ML injection Inject 30 Units into the skin 2 (two) times daily.      . isosorbide mononitrate (IMDUR) 60 MG 24 hr tablet Take 120 mg by mouth daily.        Marland Kitchen  metolazone (ZAROXOLYN) 2.5 MG tablet Take 1 tablet (2.5 mg total) by mouth as needed.  15 tablet  3  . NIFEdipine (PROCARDIA XL/ADALAT-CC) 60 MG 24 hr tablet Take 60 mg by mouth 2 (two) times daily.      . pantoprazole (PROTONIX) 40 MG tablet Take 1 tablet (40 mg total) by mouth 2 (two) times daily.  60 tablet  1  . perphenazine (TRILAFON) 2 MG tablet Take 1 tablet (2 mg total) by mouth at bedtime.  30 tablet  1  . torsemide (DEMADEX) 20 MG tablet Take 2 tablets (40 mg total) by mouth 2 (two) times daily.  120 tablet  6    PHYSICAL EXAM: Filed Vitals:   02/09/11 1129  BP: 176/60  Pulse: 64  Weight: 267 lb 8 oz (121.337 kg)  SpO2: 97%   General:  Well appearing. No respiratory difficulty HEENT: normal Neck: supple. Thick. JVP 8-9. Carotids 2+ bilat; no bruits. No lymphadenopathy or thryomegaly appreciated. Cor: PMI nondisplaced. Regular rate & rhythm. No rubs, gallops or murmurs. Lungs: clear Abdomen: Obese nontender,  nondistended. No hepatosplenomegaly. No bruits or masses. Good bowel sounds. Extremities: no cyanosis, clubbing, rash, 1+  LE edema, tightness improved Neuro: alert & oriented x 3, cranial nerves grossly intact. moves all 4 extremities w/o difficulty. Affect pleasant.  Assessment and Plan:

## 2011-02-09 NOTE — Patient Instructions (Signed)
Take metolazone 2.5 mg once a week.  Continue torsemide 40 mg twice a day.  Labs today.    Do the following things EVERYDAY: 1) Weigh yourself in the morning before breakfast. Write it down and keep it in a log. 2) Take your medicines as prescribed 3) Eat low salt foods-Limit salt (sodium) to 2000mg  per day.        4)   Stay as active as you can everyday  Follow up with Dr. Haroldine Laws in 2-3 weeks.

## 2011-02-19 ENCOUNTER — Telehealth (HOSPITAL_COMMUNITY): Payer: Self-pay | Admitting: *Deleted

## 2011-02-19 DIAGNOSIS — I5022 Chronic systolic (congestive) heart failure: Secondary | ICD-10-CM

## 2011-02-19 NOTE — Telephone Encounter (Signed)
Message copied by Scarlette Calico on Thu Feb 19, 2011  3:44 PM ------      Message from: Wynetta Emery      Created: Mon Feb 09, 2011  5:30 PM       Recheck in 1 week.

## 2011-02-23 ENCOUNTER — Telehealth (HOSPITAL_COMMUNITY): Payer: Self-pay | Admitting: *Deleted

## 2011-02-23 ENCOUNTER — Other Ambulatory Visit (INDEPENDENT_AMBULATORY_CARE_PROVIDER_SITE_OTHER): Payer: Medicare Other | Admitting: *Deleted

## 2011-02-23 DIAGNOSIS — I5022 Chronic systolic (congestive) heart failure: Secondary | ICD-10-CM

## 2011-02-23 LAB — BASIC METABOLIC PANEL
CO2: 30 mEq/L (ref 19–32)
Chloride: 99 mEq/L (ref 96–112)
Glucose, Bld: 386 mg/dL — ABNORMAL HIGH (ref 70–99)
Potassium: 4.2 mEq/L (ref 3.5–5.1)
Sodium: 135 mEq/L (ref 135–145)

## 2011-02-23 NOTE — Telephone Encounter (Signed)
Jon Russell called this am, he would like to go to have his labs drawn this am.  He will not have transportation tomorrow.  He wants to be sure this is ok.  Please review and follow up.  Thank you.

## 2011-02-23 NOTE — Telephone Encounter (Signed)
Advised pt ok to go for labs today

## 2011-02-23 NOTE — Telephone Encounter (Signed)
Per Leone Haven, PA pt needs bmet 2/18, he will go to [facility]

## 2011-02-24 ENCOUNTER — Other Ambulatory Visit: Payer: Medicare Other | Admitting: *Deleted

## 2011-03-02 ENCOUNTER — Other Ambulatory Visit (INDEPENDENT_AMBULATORY_CARE_PROVIDER_SITE_OTHER): Payer: Medicare Other | Admitting: *Deleted

## 2011-03-02 DIAGNOSIS — I5022 Chronic systolic (congestive) heart failure: Secondary | ICD-10-CM

## 2011-03-02 LAB — BASIC METABOLIC PANEL
BUN: 43 mg/dL — ABNORMAL HIGH (ref 6–23)
CO2: 30 mEq/L (ref 19–32)
Chloride: 98 mEq/L (ref 96–112)
Creatinine, Ser: 2.6 mg/dL — ABNORMAL HIGH (ref 0.4–1.5)
Glucose, Bld: 434 mg/dL — ABNORMAL HIGH (ref 70–99)
Potassium: 4.2 mEq/L (ref 3.5–5.1)

## 2011-03-04 ENCOUNTER — Telehealth (HOSPITAL_COMMUNITY): Payer: Self-pay | Admitting: *Deleted

## 2011-03-04 NOTE — Telephone Encounter (Signed)
Jon Russell called you back regarding your call to him about his labs. Thanks.

## 2011-03-04 NOTE — Telephone Encounter (Signed)
Spoke w/pt he states wt is stable, when we stopped lasix those couple of days he states it went up but when he restarted it went back down, he is scheduled to see [facility]. Dr Lorrene Reid on 2/26 at 2:30 Soon, he will continue lasix at current dose

## 2011-03-09 ENCOUNTER — Encounter (HOSPITAL_COMMUNITY): Payer: Self-pay

## 2011-03-09 ENCOUNTER — Ambulatory Visit (HOSPITAL_COMMUNITY)
Admission: RE | Admit: 2011-03-09 | Discharge: 2011-03-09 | Disposition: A | Discharge status: Home / Self Care | Payer: Medicare Other | Source: Ambulatory Visit | Attending: Internal Medicine | Admitting: Internal Medicine

## 2011-03-09 VITALS — BP 142/76 | HR 66 | Wt 266.0 lb

## 2011-03-09 DIAGNOSIS — I5032 Chronic diastolic (congestive) heart failure: Secondary | ICD-10-CM | POA: Insufficient documentation

## 2011-03-09 DIAGNOSIS — G4733 Obstructive sleep apnea (adult) (pediatric): Secondary | ICD-10-CM | POA: Insufficient documentation

## 2011-03-09 DIAGNOSIS — I10 Essential (primary) hypertension: Secondary | ICD-10-CM | POA: Insufficient documentation

## 2011-03-09 DIAGNOSIS — I509 Heart failure, unspecified: Secondary | ICD-10-CM

## 2011-03-09 DIAGNOSIS — N189 Chronic kidney disease, unspecified: Secondary | ICD-10-CM

## 2011-03-09 NOTE — Assessment & Plan Note (Addendum)
Follow up with Dr. Lorrene Reid as above.

## 2011-03-09 NOTE — Assessment & Plan Note (Addendum)
Weight looks good.  NYHA II.  Will continue current regimen. I remained concerned that his euvolemic window is quite narrow and creatinine bumps significantly with just small titration of diuretics.Stressed importance of medication compliance.  Reinforced daily weights and reviewed use of sliding scale diuretics. He has appt with Dr. Lorrene Reid this week to discuss timing of vein mapping, etc.

## 2011-03-09 NOTE — Assessment & Plan Note (Addendum)
Will monitor closely.  BP last week reportedly 120/80. Recheck BP today 142/76 will not add additional agent at this time.  If SBP > 140 consistently consider clonidine.

## 2011-03-09 NOTE — Progress Notes (Signed)
PCP: Rolanda Lundborg NP ([facility])   HPI: Jon Russell is a 51 y/o male with multiple medical problems including obesity, HTN, DM2, COPD (with ongoing tobacco use), ETOH abuse, CRI (baseline Cr 1.8-2.0), OSA on CPAP and legal blindness (due to diabetic retinopathy).  Ischemic work up in th past included a stress test at [facility] for CP that was reportedly ok.    Admitted to [facility] [id number] with diastolic HF while attending ETOH detox program at [facility]. Echo EF 55-60% with pseudonormal filling pattern. Weight at d/c was 239 pounds.  His was felt to high risk from a medical standpoint because his weight kept going up so he was discharged from [facility].    He returns for follow up today.  He is living in an apartment now.  He feels good.  Weight is stable at home.  Last week Cr jumped to 3.1, diuretic held for two days and Cr back to 2.6.  Weight went up 5-6 pounds with holding diuretic but came back down with addition of demadex.  He denies orthopnea/PND/dyspnea.  No lower extremity edema.   He is not wearing his CPAP at night because it is still at his wife's house.  Working on decreasing fluid intake.  Cigarette intake has increased possibly up to ~0.5 cigs/day.  No alcohol use. He has missed 2 doses of his night time meds because he fell asleep.     ROS: Pertinent positives and negatives as in HPI, otherwise negative.   Past Medical History  Diagnosis Date  . CHF (congestive heart failure)   . COPD (chronic obstructive pulmonary disease)   . Coronary artery disease   . Diabetes mellitus   . Hypertension   . Back pain   . Hyperlipemia   . Renal insufficiency    No Known Allergies  Current Outpatient Prescriptions  Medication Sig Dispense Refill  . ARIPiprazole (ABILIFY) 5 MG tablet Take 1 tablet (5 mg total) by mouth daily.  30 tablet  1  . aspirin 81 MG EC tablet Take 81 mg by mouth daily.       Marland Kitchen atorvastatin (LIPITOR) 40 MG tablet Take 1 tablet (40 mg total) by mouth  daily.  30 tablet  1  . baclofen (LIORESAL) 20 MG tablet Take 20 mg by mouth 2 (two) times daily.        Marland Kitchen buPROPion (WELLBUTRIN SR) 150 MG 12 hr tablet Take 150 mg by mouth daily.       . carvedilol (COREG) 25 MG tablet Take 1 tablet (25 mg total) by mouth 2 (two) times daily with a meal.  60 tablet  6  . citalopram (CELEXA) 20 MG tablet Take 20 mg by mouth daily.        Marland Kitchen dextran 70-hypromellose (TEARS RENEWED) ophthalmic solution Place 1 drop into both eyes 2 (two) times daily.       . diphenhydrAMINE (BENADRYL) 25 MG tablet Take 1 tablet (25 mg total) by mouth at bedtime as needed.  30 tablet  1  . dorzolamide-timolol (COSOPT) 22.3-6.8 MG/ML ophthalmic solution 1 drop 2 (two) times daily.        . hydrALAZINE (APRESOLINE) 100 MG tablet Take 1 tablet (100 mg total) by mouth 3 (three) times daily.  90 tablet  3  . insulin aspart (NOVOLOG) 100 UNIT/ML injection Inject 8 Units into the skin 3 (three) times daily before meals. Sliding scale      . insulin detemir (LEVEMIR) 100 UNIT/ML injection Inject 30 Units into  the skin 2 (two) times daily.      . isosorbide mononitrate (IMDUR) 60 MG 24 hr tablet Take 120 mg by mouth daily.        . metolazone (ZAROXOLYN) 2.5 MG tablet Take 1 tablet (2.5 mg total) by mouth as needed.  15 tablet  3  . NIFEdipine (PROCARDIA XL/ADALAT-CC) 60 MG 24 hr tablet Take 60 mg by mouth 2 (two) times daily.      . pantoprazole (PROTONIX) 40 MG tablet Take 1 tablet (40 mg total) by mouth 2 (two) times daily.  60 tablet  1  . perphenazine (TRILAFON) 2 MG tablet Take 1 tablet (2 mg total) by mouth at bedtime.  30 tablet  1  . torsemide (DEMADEX) 20 MG tablet Take 2 tablets (40 mg total) by mouth 2 (two) times daily.  120 tablet  6    PHYSICAL EXAM: Filed Vitals:   03/09/11 1432 03/09/11 1525  BP: 162/88 142/76  Pulse: 66   Weight: 266 lb (120.657 kg)   SpO2: 98%   Recheck 142/76  General:  Well appearing. No respiratory difficulty HEENT: normal Neck: supple. Thick.  JVP 6-7. Carotids 2+ bilat; no bruits. No lymphadenopathy or thryomegaly appreciated. Cor: PMI nondisplaced. Regular rate & rhythm. No rubs, gallops or murmurs. Lungs: clear Abdomen: Obese nontender, nondistended. No hepatosplenomegaly. No bruits or masses. Good bowel sounds. Extremities: no cyanosis, clubbing, rash, trace  LE edema, tightness improved Neuro: alert & oriented x 3, cranial nerves grossly intact. moves all 4 extremities w/o difficulty. Affect pleasant.  Assessment and Plan:

## 2011-03-09 NOTE — Assessment & Plan Note (Signed)
He is agreeable to restart CPAP use.  He will try nasal saline spray because his nose gets dry in the mornings.

## 2011-03-09 NOTE — Patient Instructions (Signed)
Continue current medications  Follow up in 4-5 weeks.

## 2011-03-10 ENCOUNTER — Other Ambulatory Visit (HOSPITAL_COMMUNITY): Payer: Self-pay | Admitting: Internal Medicine

## 2011-03-10 ENCOUNTER — Other Ambulatory Visit (HOSPITAL_COMMUNITY): Payer: Self-pay | Admitting: Adult Health

## 2011-03-11 ENCOUNTER — Telehealth: Payer: Self-pay | Admitting: Internal Medicine

## 2011-03-11 ENCOUNTER — Encounter: Payer: Self-pay | Admitting: Internal Medicine

## 2011-03-11 NOTE — Telephone Encounter (Signed)
LOV,12 lead faxed to [facility]  @ [phone] 03/11/11/KM

## 2011-03-20 ENCOUNTER — Other Ambulatory Visit: Payer: Self-pay | Admitting: Nephrology

## 2011-03-20 DIAGNOSIS — N189 Chronic kidney disease, unspecified: Secondary | ICD-10-CM

## 2011-03-24 ENCOUNTER — Ambulatory Visit
Admission: RE | Admit: 2011-03-24 | Discharge: 2011-03-24 | Disposition: A | Discharge status: Home / Self Care | Payer: Medicare Other | Source: Ambulatory Visit | Attending: Nephrology | Admitting: Nephrology

## 2011-03-24 DIAGNOSIS — N189 Chronic kidney disease, unspecified: Secondary | ICD-10-CM

## 2011-04-06 ENCOUNTER — Ambulatory Visit (HOSPITAL_COMMUNITY): Payer: Medicare Other

## 2011-04-09 ENCOUNTER — Other Ambulatory Visit: Payer: Self-pay

## 2011-04-09 DIAGNOSIS — Z01810 Encounter for preprocedural cardiovascular examination: Secondary | ICD-10-CM

## 2011-04-09 DIAGNOSIS — N184 Chronic kidney disease, stage 4 (severe): Secondary | ICD-10-CM

## 2011-04-14 ENCOUNTER — Ambulatory Visit (HOSPITAL_COMMUNITY)
Admission: RE | Admit: 2011-04-14 | Discharge: 2011-04-14 | Disposition: A | Discharge status: Home / Self Care | Payer: Medicare Other | Source: Ambulatory Visit | Attending: Internal Medicine | Admitting: Internal Medicine

## 2011-04-14 VITALS — BP 144/56 | HR 71 | Wt 269.8 lb

## 2011-04-14 DIAGNOSIS — I5032 Chronic diastolic (congestive) heart failure: Secondary | ICD-10-CM | POA: Insufficient documentation

## 2011-04-14 MED ORDER — METOLAZONE 2.5 MG PO TABS: ORAL_TABLET | ORAL | Status: DC

## 2011-04-14 NOTE — Patient Instructions (Signed)
Take Metolazone 2.5 mg Sunday and Thursday  Do the following things EVERYDAY: 1) Weigh yourself in the morning before breakfast. Write it down and keep it in a log. 2) Take your medicines as prescribed 3) Eat low salt foods--Limit salt (sodium) to 2000mg  per day.  4) Stay as active as you can everyday  Please obtain BMET  In 2 weeks.   Follow in one month

## 2011-04-14 NOTE — Assessment & Plan Note (Addendum)
Weight is up 30 pounds over the last 3 months. However volume status minimally elevated at worst. Suspect most of the wight gain is due to overeating after quitting ETOH/tobacco. Counseled on needed for dietary discretion. Will also increase Metolazone to 2.5 mg twice a week (was taking weekly). He will take Metolazone Sunday and Thursday. Repeat BMET in 2 weeks.  Re-educated on low salt diet and food choices. Instructed to to call HF clinic if weight continues to increase or dizziness occurs. Provided instruction for daily weights. He is following with renal and I suspect he may progress to HD within the next year. Follow up in 1 month.

## 2011-04-16 NOTE — Progress Notes (Signed)
Patient ID: Jon Russell, male   DOB: 07-03-1960, 51 y.o.   MRN: [id number]  HPI: Jon Russell is a 51 y/o male with multiple medical problems including obesity, HTN, DM2, COPD (with ongoing tobacco use), ETOH abuse, CRI (baseline Cr 1.8-2.0), OSA on CPAP and legal blindness (due to diabetic retinopathy).   Ischemic work up in th past included a stress test at [facility] for CP that was reportedly ok.  Admitted to [facility] [id number] with diastolic HF while attending ETOH detox program at [facility]. Echo EF 55-60% with pseudonormal filling pattern. Weight at d/c was 239 pounds. His was felt to high risk from a medical standpoint because his weight kept going up so he was discharged from [facility].    Plan for vein mapping April 23, 2011 in anticipation of HD in the future. Dr Lorrene Reid added Clonidine 0.1 mg bid last month. Weight at home 262-270 pounds. If weight is up to 270 he Russell take an extra metolazone and he has required extra Metolazone twice in the last month.   He returns for follow up. Denies SOB/PND/Orthopnea.  SOB going up hills and stairs. He is wearing his CPAP at night. Drinking less than 2 liters per day. Working on decreasing fluid intake.Non- compliant with low salt diet. Eat chips and macroni.  Continues to smoke 5-6 cigarettes per day.  No alcohol use.        ROS: All systems negative except as listed in HPI, PMH and Problem List.  Past Medical History  Diagnosis Date  . CHF (congestive heart failure)   . COPD (chronic obstructive pulmonary disease)   . Coronary artery disease   . Diabetes mellitus   . Hypertension   . Back pain   . Hyperlipemia   . Renal insufficiency     Current Outpatient Prescriptions  Medication Sig Dispense Refill  . ARIPiprazole (ABILIFY) 5 MG tablet Take 1 tablet (5 mg total) by mouth daily.  30 tablet  1  . aspirin 81 MG EC tablet Take 81 mg by mouth daily.       Marland Kitchen atorvastatin (LIPITOR) 40 MG tablet TAKE 1 TABLET BY MOUTH BY MOUTH EVERY DAY  30 tablet  0    . baclofen (LIORESAL) 20 MG tablet Take 20 mg by mouth at bedtime.       Marland Kitchen buPROPion (WELLBUTRIN SR) 150 MG 12 hr tablet Take 150 mg by mouth daily.       . carvedilol (COREG) 25 MG tablet Take 1 tablet (25 mg total) by mouth 2 (two) times daily with a meal.  60 tablet  6  . citalopram (CELEXA) 20 MG tablet Take 20 mg by mouth daily.        . cloNIDine (CATAPRES) 0.1 MG tablet Take 0.1 mg by mouth 2 (two) times daily.      Marland Kitchen dextran 70-hypromellose (TEARS RENEWED) ophthalmic solution Place 1 drop into both eyes 2 (two) times daily.       . diphenhydrAMINE (BENADRYL) 25 MG tablet Take 1 tablet (25 mg total) by mouth at bedtime as needed.  30 tablet  1  . dorzolamide-timolol (COSOPT) 22.3-6.8 MG/ML ophthalmic solution 1 drop 2 (two) times daily.        . hydrALAZINE (APRESOLINE) 100 MG tablet Take 1 tablet (100 mg total) by mouth 3 (three) times daily.  90 tablet  3  . insulin aspart (NOVOLOG) 100 UNIT/ML injection Inject 8 Units into the skin 3 (three) times daily before meals. Sliding scale      .  insulin detemir (LEVEMIR) 100 UNIT/ML injection Inject 30 Units into the skin 2 (two) times daily.      . isosorbide mononitrate (IMDUR) 60 MG 24 hr tablet Take 120 mg by mouth daily.        . metolazone (ZAROXOLYN) 2.5 MG tablet Take Sunday and Friday  15 tablet  3  . NIFEdipine (PROCARDIA XL/ADALAT-CC) 60 MG 24 hr tablet Take 60 mg by mouth 2 (two) times daily.      . pantoprazole (PROTONIX) 40 MG tablet Take 1 tablet (40 mg total) by mouth 2 (two) times daily.  60 tablet  1  . perphenazine (TRILAFON) 2 MG tablet Take 1 tablet (2 mg total) by mouth at bedtime.  30 tablet  1  . testosterone (ANDROGEL) 50 MG/5GM GEL Place 5 g onto the skin daily.      Marland Kitchen torsemide (DEMADEX) 20 MG tablet Take 2 tablets (40 mg total) by mouth 2 (two) times daily.  120 tablet  6     PHYSICAL EXAM: Filed Vitals:   04/14/11 0919  BP: 144/56  Pulse: 71   Weight change:  269 (266) General:  Well appearing. No resp  difficulty HEENT: normal Neck: supple. JVP 7-8 Carotids 2+ bilaterally; no bruits. No lymphadenopathy or thryomegaly appreciated. Cor: PMI normal. Regular rate & rhythm. No rubs, gallops or murmurs. Lungs: clear Abdomen: soft, nontender, distended. No hepatosplenomegaly. No bruits or masses. Good bowel sounds. Extremities: no cyanosis, clubbing, rash, R and LLE 1+ Neuro: alert & orientedx3, cranial nerves grossly intact. Moves all 4 extremities w/o difficulty. Affect pleasant.      ASSESSMENT & PLAN:

## 2011-04-21 ENCOUNTER — Encounter: Payer: Self-pay | Admitting: Vascular Surgery

## 2011-04-23 ENCOUNTER — Encounter (INDEPENDENT_AMBULATORY_CARE_PROVIDER_SITE_OTHER): Payer: Medicare Other | Admitting: *Deleted

## 2011-04-23 ENCOUNTER — Ambulatory Visit (INDEPENDENT_AMBULATORY_CARE_PROVIDER_SITE_OTHER): Payer: Medicare Other | Admitting: Vascular Surgery

## 2011-04-23 ENCOUNTER — Encounter: Payer: Self-pay | Admitting: Vascular Surgery

## 2011-04-23 VITALS — BP 119/65 | HR 59 | Resp 18 | Ht 67.0 in | Wt 269.0 lb

## 2011-04-23 DIAGNOSIS — Z01810 Encounter for preprocedural cardiovascular examination: Secondary | ICD-10-CM

## 2011-04-23 DIAGNOSIS — N186 End stage renal disease: Secondary | ICD-10-CM

## 2011-04-23 DIAGNOSIS — Z992 Dependence on renal dialysis: Secondary | ICD-10-CM | POA: Insufficient documentation

## 2011-04-23 DIAGNOSIS — N184 Chronic kidney disease, stage 4 (severe): Secondary | ICD-10-CM

## 2011-04-23 NOTE — Progress Notes (Signed)
[facility] [place] HISTORY AND PHYSICAL   History of Present Illness:  Patient is a 51 y.o. year old male who presents for placement of a permanent hemodialysis access. The patient is right handed .  The patient is not currently on hemodialysis.  The cause of renal failure is thought to be secondary to multiple factors.  Other chronic medical problems include coronary artery disease, COPD, diabetes, hypertension, hyperlipidemia.  These are all currently stable  Past Medical History  Diagnosis Date  . CHF (congestive heart failure)   . COPD (chronic obstructive pulmonary disease)   . Coronary artery disease   . Diabetes mellitus   . Hypertension   . Back pain   . Hyperlipemia   . Renal insufficiency   . Anemia     Past Surgical History  Procedure Date  . Eye surgery   . Lung biopsy      Social History History  Substance Use Topics  . Smoking status: Former Smoker -- 0.5 packs/day for 2 years    Types: Cigarettes    Quit date: 01/12/2006  . Smokeless tobacco: Never Used  . Alcohol Use: Yes     states he drinks 1/5 of liquor dailt- last drink 30 days ago    Family History Family History  Problem Relation Age of Onset  . Diabetes Mother   . Heart disease Mother   . Hyperlipidemia Mother   . Hypertension Mother   . Diabetes Father   . Heart disease Father   . Hyperlipidemia Father   . Hypertension Father   . Diabetes Sister   . Hyperlipidemia Sister   . Hypertension Sister   . Diabetes Brother   . Heart disease Brother   . Hyperlipidemia Brother   . Hypertension Brother   . Heart attack Brother   . Other Brother     Amputation    Allergies  No Known Allergies   Current Outpatient Prescriptions  Medication Sig Dispense Refill  . ARIPiprazole (ABILIFY) 5 MG tablet Take 1 tablet (5 mg total) by mouth daily.  30 tablet  1  . aspirin 81 MG EC tablet Take 81 mg by mouth daily.       Marland Kitchen atorvastatin (LIPITOR) 40 MG tablet TAKE 1 TABLET BY  MOUTH BY MOUTH EVERY DAY  30 tablet  0  . baclofen (LIORESAL) 20 MG tablet Take 20 mg by mouth at bedtime.       Marland Kitchen buPROPion (WELLBUTRIN SR) 150 MG 12 hr tablet Take 150 mg by mouth daily.       . carvedilol (COREG) 25 MG tablet Take 1 tablet (25 mg total) by mouth 2 (two) times daily with a meal.  60 tablet  6  . citalopram (CELEXA) 20 MG tablet Take 20 mg by mouth daily.        . cloNIDine (CATAPRES) 0.1 MG tablet Take 0.1 mg by mouth 2 (two) times daily.      Marland Kitchen dextran 70-hypromellose (TEARS RENEWED) ophthalmic solution Place 1 drop into both eyes 2 (two) times daily.       . diphenhydrAMINE (BENADRYL) 25 MG tablet Take 1 tablet (25 mg total) by mouth at bedtime as needed.  30 tablet  1  . dorzolamide-timolol (COSOPT) 22.3-6.8 MG/ML ophthalmic solution 1 drop 2 (two) times daily.        . hydrALAZINE (APRESOLINE) 100 MG tablet Take 1 tablet (100 mg total) by mouth 3 (three) times daily.  90 tablet  3  . insulin  aspart (NOVOLOG) 100 UNIT/ML injection Inject 8 Units into the skin 3 (three) times daily before meals. Sliding scale      . insulin detemir (LEVEMIR) 100 UNIT/ML injection Inject 30 Units into the skin 2 (two) times daily.      . isosorbide mononitrate (IMDUR) 60 MG 24 hr tablet Take 120 mg by mouth daily.        . metolazone (ZAROXOLYN) 2.5 MG tablet Take Sunday and Friday  15 tablet  3  . NIFEdipine (PROCARDIA XL/ADALAT-CC) 60 MG 24 hr tablet Take 60 mg by mouth 2 (two) times daily.      . pantoprazole (PROTONIX) 40 MG tablet Take 1 tablet (40 mg total) by mouth 2 (two) times daily.  60 tablet  1  . perphenazine (TRILAFON) 2 MG tablet Take 1 tablet (2 mg total) by mouth at bedtime.  30 tablet  1  . testosterone (ANDROGEL) 50 MG/5GM GEL Place 5 g onto the skin daily.      Marland Kitchen torsemide (DEMADEX) 20 MG tablet Take 2 tablets (40 mg total) by mouth 2 (two) times daily.  120 tablet  6    ROS:   General:  No weight loss, Fever, chills  HEENT: No recent headaches, no nasal bleeding, no  visual changes, no sore throat  Neurologic: No dizziness, blackouts, seizures. No recent symptoms of stroke or mini- stroke. No recent episodes of slurred speech, or temporary blindness.  Cardiac: No recent episodes of chest pain/pressure, no shortness of breath at rest.  + shortness of breath with exertion.  Denies history of atrial fibrillation or irregular heartbeat  Vascular: No history of rest pain in feet.  No history of claudication.  No history of non-healing ulcer, No history of DVT   Pulmonary: No home oxygen, no productive cough, no hemoptysis,  No asthma or wheezing  Musculoskeletal:  [ ]  Arthritis, [ ]  Low back pain,  [ ]  Joint pain  Hematologic:No history of hypercoagulable state.  No history of easy bleeding.  No history of anemia  Gastrointestinal: No hematochezia or melena,  No gastroesophageal reflux, no trouble swallowing  Urinary: [ ]  chronic Kidney disease, [ ]  on HD - [ ]  MWF or [ ]  TTHS, [ ]  Burning with urination, [ ]  Frequent urination, [ ]  Difficulty urinating;   Skin: No rashes  Psychological: No history of anxiety,  No history of depression   Physical Examination  Filed Vitals:   04/23/11 0959  BP: 119/65  Pulse: 59  Resp: 18  Height: 5\' 7"  (1.702 m)  Weight: 269 lb (122.018 kg)  SpO2: 98%    Body mass index is 42.13 kg/(m^2).  General:  Alert and oriented, no acute distress HEENT: Normal Neck: No bruit or JVD Pulmonary: Clear to auscultation bilaterally Cardiac: Regular Rate and Rhythm without murmur Gastrointestinal: Soft, non-tender, non-distended, no mass Skin: No rash Extremity Pulses:  2+ radial, brachial pulses bilaterally, obese arm Musculoskeletal: No deformity or edema  Neurologic: Upper and lower extremity motor 5/5 and symmetric  DATA: The patient had a vein mapping ultrasound today which I reviewed and interpreted.  Cephalic vein is greater than 3 mm in diameter throughout its course left side,  is greater than 3 mm in diameter  throughout its course on the right side is greater than 4 mm in diameter in the basilic right and left   ASSESSMENT: Needs hemodialysis access   PLAN:  I believe the best option for the patient will be a left radiocephalic AV fistula. The  vein is of good quality but may be slightly deep. I did discuss with the patient today risks benefits possible palpitations and procedure details including but not limited to bleeding infection non-maturation of the fistula possibility of other further intervention he understands and agrees to proceed.  His fistula is scheduled for 05/05/2011  Ruta Hinds, MD [facility] [place]: [phone] Pager: [phone]

## 2011-04-27 ENCOUNTER — Other Ambulatory Visit: Payer: Self-pay

## 2011-04-27 ENCOUNTER — Other Ambulatory Visit (INDEPENDENT_AMBULATORY_CARE_PROVIDER_SITE_OTHER): Payer: Medicare Other

## 2011-04-27 DIAGNOSIS — I5032 Chronic diastolic (congestive) heart failure: Secondary | ICD-10-CM

## 2011-04-27 DIAGNOSIS — I509 Heart failure, unspecified: Secondary | ICD-10-CM

## 2011-04-27 LAB — BASIC METABOLIC PANEL
BUN: 60 mg/dL — ABNORMAL HIGH (ref 6–23)
CO2: 27 mEq/L (ref 19–32)
Calcium: 8.8 mg/dL (ref 8.4–10.5)
GFR: 35.07 mL/min — ABNORMAL LOW (ref >60.00)
Glucose, Bld: 290 mg/dL — ABNORMAL HIGH (ref 70–99)

## 2011-04-28 ENCOUNTER — Encounter (HOSPITAL_COMMUNITY): Payer: Self-pay | Admitting: Pharmacy Technician

## 2011-05-01 NOTE — Procedures (Unsigned)
CEPHALIC VEIN MAPPING  INDICATION:  Preop vein mapping for dialysis access.  HISTORY: Chronic kidney disease.  EXAM:  The right cephalic vein is compressible.  Diameter measurements range from 0.46 to 0.37 cm.  The right basilic vein is compressible.  Measurements range from 0.55 to 0.4 cm.  The left cephalic vein is compressible.  Measurements range from 0.46 to 0.35 cm.  The left basilic vein is compressible.  Diameter measurements range from 0.6 to 0.4 cm.  See attached worksheet for all measurements.  IMPRESSION:  Patent left and right cephalic and basilic veins as described above.  ___________________________________________ Jessy Oto. Fields, MD  LT/MEDQ  D:  04/23/2011  T:  04/23/2011  Job:  [id number]

## 2011-05-04 ENCOUNTER — Encounter (HOSPITAL_COMMUNITY)
Admission: RE | Admit: 2011-05-04 | Discharge: 2011-05-04 | Disposition: A | Discharge status: Home / Self Care | Payer: Medicare Other | Source: Ambulatory Visit | Attending: Vascular Surgery | Admitting: Vascular Surgery

## 2011-05-04 ENCOUNTER — Encounter (HOSPITAL_COMMUNITY): Payer: Self-pay

## 2011-05-04 LAB — SURGICAL PCR SCREEN: Staphylococcus aureus: NEGATIVE

## 2011-05-04 MED ORDER — CEFAZOLIN SODIUM-DEXTROSE 2-3 GM-% IV SOLR
2.0000 g | INTRAVENOUS | Status: AC
  Administered 2011-05-05: 2 g via INTRAVENOUS
  Filled 2011-05-04 (×2): qty 50

## 2011-05-04 NOTE — Pre-Procedure Instructions (Signed)
Jon Russell  05/04/2011   Your procedure is scheduled on:  05/05/11  Report to [facility] at 530 AM.  Call this number if you have problems the morning of surgery: [phone]   Remember:   Do not eat food:After Midnight.  May have clear liquids: up to 4 Hours before arrival.  Clear liquids include soda, tea, black coffee, apple or grape juice, broth.  Take these medicines the morning of surgery with A SIP OF WATER: *baclofen,carvedilol,celexa,eye drops,apresoline,procardia,protonix,imdur   Do not wear jewelry, make-up or nail polish.  Do not wear lotions, powders, or perfumes. You may wear deodorant.  Do not shave 48 hours prior to surgery.  Do not bring valuables to the hospital.  Contacts, dentures or bridgework may not be worn into surgery.  Leave suitcase in the car. After surgery it may be brought to your room.  For patients admitted to the hospital, checkout time is 11:00 AM the day of discharge.   Patients discharged the day of surgery will not be allowed to drive home.  Name and phone number of your driver: family  Special Instructions: CHG Shower Use Special Wash: 1/2 bottle night before surgery and 1/2 bottle morning of surgery.   Please read over the following fact sheets that you were given: Pain Booklet, Coughing and Deep Breathing, MRSA Information and Surgical Site Infection Prevention

## 2011-05-04 NOTE — Progress Notes (Signed)
Dr Sung Amabile called for ekg, stress test.

## 2011-05-04 NOTE — Progress Notes (Signed)
Echo in epic,also ekg

## 2011-05-05 ENCOUNTER — Encounter (HOSPITAL_COMMUNITY)
Admission: RE | Disposition: A | Discharge status: Home / Self Care | Payer: Self-pay | Source: Ambulatory Visit | Attending: Vascular Surgery

## 2011-05-05 ENCOUNTER — Encounter (HOSPITAL_COMMUNITY): Payer: Self-pay | Admitting: *Deleted

## 2011-05-05 ENCOUNTER — Encounter (HOSPITAL_COMMUNITY): Payer: Self-pay | Admitting: Anesthesiology

## 2011-05-05 ENCOUNTER — Ambulatory Visit (HOSPITAL_COMMUNITY)
Admission: RE | Admit: 2011-05-05 | Discharge: 2011-05-05 | Disposition: A | Discharge status: Home / Self Care | Payer: Medicare Other | Source: Ambulatory Visit | Attending: Vascular Surgery | Admitting: Vascular Surgery

## 2011-05-05 ENCOUNTER — Ambulatory Visit (HOSPITAL_COMMUNITY): Payer: Medicare Other

## 2011-05-05 ENCOUNTER — Ambulatory Visit (HOSPITAL_COMMUNITY): Payer: Medicare Other | Admitting: Anesthesiology

## 2011-05-05 DIAGNOSIS — J4489 Other specified chronic obstructive pulmonary disease: Secondary | ICD-10-CM | POA: Insufficient documentation

## 2011-05-05 DIAGNOSIS — E119 Type 2 diabetes mellitus without complications: Secondary | ICD-10-CM | POA: Insufficient documentation

## 2011-05-05 DIAGNOSIS — R443 Hallucinations, unspecified: Secondary | ICD-10-CM | POA: Insufficient documentation

## 2011-05-05 DIAGNOSIS — J449 Chronic obstructive pulmonary disease, unspecified: Secondary | ICD-10-CM | POA: Insufficient documentation

## 2011-05-05 DIAGNOSIS — F17200 Nicotine dependence, unspecified, uncomplicated: Secondary | ICD-10-CM | POA: Insufficient documentation

## 2011-05-05 DIAGNOSIS — I120 Hypertensive chronic kidney disease with stage 5 chronic kidney disease or end stage renal disease: Secondary | ICD-10-CM | POA: Insufficient documentation

## 2011-05-05 DIAGNOSIS — N186 End stage renal disease: Secondary | ICD-10-CM

## 2011-05-05 DIAGNOSIS — G4730 Sleep apnea, unspecified: Secondary | ICD-10-CM | POA: Insufficient documentation

## 2011-05-05 HISTORY — PX: AV FISTULA PLACEMENT: SHX1204

## 2011-05-05 LAB — GLUCOSE, CAPILLARY: Glucose-Capillary: 195 mg/dL — ABNORMAL HIGH (ref 70–99)

## 2011-05-05 LAB — POCT I-STAT 4, (NA,K, GLUC, HGB,HCT)
HCT: 33 % — ABNORMAL LOW (ref 39.0–52.0)
Sodium: 137 mEq/L (ref 135–145)

## 2011-05-05 SURGERY — ARTERIOVENOUS (AV) FISTULA CREATION
Anesthesia: General | Site: Arm Lower | Laterality: Left | Wound class: Clean

## 2011-05-05 MED ORDER — PROPOFOL 10 MG/ML IV EMUL
INTRAVENOUS | Status: DC | PRN
  Administered 2011-05-05: 170 mg via INTRAVENOUS

## 2011-05-05 MED ORDER — HEPARIN SODIUM (PORCINE) 1000 UNIT/ML IJ SOLN
INTRAMUSCULAR | Status: DC | PRN
  Administered 2011-05-05: 5000 [IU] via INTRAVENOUS

## 2011-05-05 MED ORDER — OXYCODONE HCL 5 MG PO TABS: 5.0000 mg | ORAL_TABLET | ORAL | Status: AC | PRN

## 2011-05-05 MED ORDER — ONDANSETRON HCL 4 MG/2ML IJ SOLN
INTRAMUSCULAR | Status: DC | PRN
  Administered 2011-05-05: 4 mg via INTRAVENOUS

## 2011-05-05 MED ORDER — LIDOCAINE HCL (PF) 1 % IJ SOLN
INTRAMUSCULAR | Status: DC | PRN
  Administered 2011-05-05: 7 mL

## 2011-05-05 MED ORDER — INSULIN ASPART 100 UNIT/ML ~~LOC~~ SOLN
5.0000 [IU] | Freq: Once | SUBCUTANEOUS | Status: AC
  Administered 2011-05-05: 5 [IU] via SUBCUTANEOUS

## 2011-05-05 MED ORDER — FENTANYL CITRATE 0.05 MG/ML IJ SOLN
INTRAMUSCULAR | Status: DC | PRN
  Administered 2011-05-05: 25 ug via INTRAVENOUS
  Administered 2011-05-05: 50 ug via INTRAVENOUS

## 2011-05-05 MED ORDER — 0.9 % SODIUM CHLORIDE (POUR BTL) OPTIME
TOPICAL | Status: DC | PRN
  Administered 2011-05-05: 1000 mL

## 2011-05-05 MED ORDER — ONDANSETRON HCL 4 MG/2ML IJ SOLN: 4.0000 mg | Freq: Four times a day (QID) | INTRAMUSCULAR | Status: DC | PRN

## 2011-05-05 MED ORDER — HYDROMORPHONE HCL PF 1 MG/ML IJ SOLN: 0.2500 mg | INTRAMUSCULAR | Status: DC | PRN

## 2011-05-05 MED ORDER — SODIUM CHLORIDE 0.9 % IV SOLN: INTRAVENOUS | Status: DC

## 2011-05-05 MED ORDER — LIDOCAINE HCL (CARDIAC) 20 MG/ML IV SOLN
INTRAVENOUS | Status: DC | PRN
  Administered 2011-05-05: 80 mg via INTRAVENOUS

## 2011-05-05 MED ORDER — SODIUM CHLORIDE 0.9 % IR SOLN
Status: DC | PRN
  Administered 2011-05-05: 09:00:00

## 2011-05-05 MED ORDER — SODIUM CHLORIDE 0.9 % IV SOLN
INTRAVENOUS | Status: DC | PRN
  Administered 2011-05-05: 07:00:00 via INTRAVENOUS

## 2011-05-05 SURGICAL SUPPLY — 43 items
ADH SKN CLS APL DERMABOND .7 (GAUZE/BANDAGES/DRESSINGS) ×1
CANISTER SUCTION 2500CC (MISCELLANEOUS) ×2 IMPLANT
CLIP TI MEDIUM 6 (CLIP) ×2 IMPLANT
CLIP TI WIDE RED SMALL 6 (CLIP) ×2 IMPLANT
CLOTH BEACON ORANGE TIMEOUT ST (SAFETY) ×2 IMPLANT
COVER PROBE W GEL 5X96 (DRAPES) ×2 IMPLANT
COVER SURGICAL LIGHT HANDLE (MISCELLANEOUS) ×4 IMPLANT
DECANTER SPIKE VIAL GLASS SM (MISCELLANEOUS) ×2 IMPLANT
DERMABOND ADVANCED (GAUZE/BANDAGES/DRESSINGS) ×1
DERMABOND ADVANCED .7 DNX12 (GAUZE/BANDAGES/DRESSINGS) ×1 IMPLANT
DRAIN PENROSE 1/4X12 LTX STRL (WOUND CARE) ×2 IMPLANT
ELECT REM PT RETURN 9FT ADLT (ELECTROSURGICAL) ×2
ELECTRODE REM PT RTRN 9FT ADLT (ELECTROSURGICAL) ×1 IMPLANT
GAUZE SPONGE 2X2 8PLY STRL LF (GAUZE/BANDAGES/DRESSINGS) ×1 IMPLANT
GEL ULTRASOUND 20GR AQUASONIC (MISCELLANEOUS) IMPLANT
GLOVE BIO SURGEON STRL SZ 6.5 (GLOVE) ×1 IMPLANT
GLOVE BIO SURGEON STRL SZ7 (GLOVE) ×1 IMPLANT
GLOVE BIO SURGEON STRL SZ7.5 (GLOVE) ×2 IMPLANT
GLOVE BIOGEL PI IND STRL 6.5 (GLOVE) IMPLANT
GLOVE BIOGEL PI IND STRL 7.0 (GLOVE) IMPLANT
GLOVE BIOGEL PI IND STRL 8 (GLOVE) IMPLANT
GLOVE BIOGEL PI INDICATOR 6.5 (GLOVE) ×2
GLOVE BIOGEL PI INDICATOR 7.0 (GLOVE) ×1
GLOVE BIOGEL PI INDICATOR 8 (GLOVE) ×1
GOWN PREVENTION PLUS XLARGE (GOWN DISPOSABLE) ×2 IMPLANT
GOWN STRL NON-REIN LRG LVL3 (GOWN DISPOSABLE) ×4 IMPLANT
KIT BASIN OR (CUSTOM PROCEDURE TRAY) ×2 IMPLANT
KIT ROOM TURNOVER OR (KITS) ×2 IMPLANT
LOOP VESSEL MINI RED (MISCELLANEOUS) IMPLANT
NS IRRIG 1000ML POUR BTL (IV SOLUTION) ×2 IMPLANT
PACK CV ACCESS (CUSTOM PROCEDURE TRAY) ×2 IMPLANT
PAD ARMBOARD 7.5X6 YLW CONV (MISCELLANEOUS) ×4 IMPLANT
SPONGE GAUZE 2X2 STER 10/PKG (GAUZE/BANDAGES/DRESSINGS) ×1
SPONGE SURGIFOAM ABS GEL 100 (HEMOSTASIS) IMPLANT
SUT PROLENE 6 0 CC (SUTURE) ×1 IMPLANT
SUT PROLENE 7 0 BV 1 (SUTURE) ×2 IMPLANT
SUT VIC AB 3-0 SH 27 (SUTURE) ×2
SUT VIC AB 3-0 SH 27X BRD (SUTURE) ×1 IMPLANT
SUT VICRYL 4-0 PS2 18IN ABS (SUTURE) ×2 IMPLANT
TOWEL OR 17X24 6PK STRL BLUE (TOWEL DISPOSABLE) ×2 IMPLANT
TOWEL OR 17X26 10 PK STRL BLUE (TOWEL DISPOSABLE) ×2 IMPLANT
UNDERPAD 30X30 INCONTINENT (UNDERPADS AND DIAPERS) ×2 IMPLANT
WATER STERILE IRR 1000ML POUR (IV SOLUTION) ×2 IMPLANT

## 2011-05-05 NOTE — Op Note (Signed)
Procedure: Leftt Radial Cephalic AV fistula  Preop: ESRD  Postop: ESRD  Anesthesia: MAC with local  Assistant:Samantha Ellington, PA-C  Findings: 3 mm cephalic vein      2.5  mm radial artery with some calcification   Procedure Details: The left upper extremity was prepped and draped in the usual sterile fashion. Local anesthesia was infiltrated midway between the cephalic and radial artery anatomically.  A longitudinal skin incision was then made in this location at the distal right forearm.  The incision was carried into the subcutaneous tissues down to level cephalic vein. The vein had some spasm but was overall reasonable quality accepting a 3 mm dilator.  The vein was dissected free circumferentially and small side branches ligated and divided between silk ties. The distal end was ligated and the vein probed and found to accept up to a 3 mm dilator.  This was gently distended with heparinized saline, spatulated,  and marked for orientation.  Next the radial artery was dissected free in the medial portion incision. The artery was  2.5 mm in diameter with some calcification but a reasonable pulse. The vessel loops were placed proximal and distal to the planned site of arteriotomy. The patient was given 5000 units of intravenous heparin. After appropriate circulation time, the vessel loops were used to control the artery. A longitudinal opening was made in the rightt radial artery. The vein was controlled proximally with a fine bulldog clamp. The vein was then swung over to the artery and sewn end of vein to side of artery using a running 7-0 Prolene suture. Just prior to completion, the anastomosis was fore bled back bled and thoroughly flushed. The anastomosis was secured, vessel loops released, and there was a palpable thrill in the fistula immediately. The patient was given 50 mg of Protamine for heparin reversal.   After hemostasis was obtained, the subcutaneous tissues were reapproximated  using a running 3-0 Vicryl suture. The skin was then closed with a 4 Vicryl subcuticular stitch. Dermabond was applied to the skin incision.    Ruta Hinds, MD [facility] [place] Office: [phone] Pager: [phone]

## 2011-05-05 NOTE — Anesthesia Procedure Notes (Signed)
Procedure Name: LMA Insertion Date/Time: 05/05/2011 7:57 AM Performed by: Manuela Schwartz B Pre-anesthesia Checklist: Patient identified, Emergency Drugs available, Suction available, Patient being monitored and Timeout performed Patient Re-evaluated:Patient Re-evaluated prior to inductionOxygen Delivery Method: Circle system utilized Preoxygenation: Pre-oxygenation with 100% oxygen Intubation Type: IV induction LMA: LMA inserted LMA Size: 5.0 Number of attempts: 1 Placement Confirmation: positive ETCO2 and breath sounds checked- equal and bilateral Tube secured with: Tape Dental Injury: Teeth and Oropharynx as per pre-operative assessment

## 2011-05-05 NOTE — Anesthesia Postprocedure Evaluation (Signed)
Anesthesia Post Note  Patient: Jon Russell  Procedure(s) Performed: Procedure(s) (LRB): ARTERIOVENOUS (AV) FISTULA CREATION (Left)  Anesthesia type: General  Patient location: PACU  Post pain: Pain level controlled and Adequate analgesia  Post assessment: Post-op Vital signs reviewed, Patient's Cardiovascular Status Stable, Respiratory Function Stable, Patent Airway and Pain level controlled  Last Vitals:  Filed Vitals:   05/05/11 0949  BP: 143/77  Pulse: 65  Temp: 36.1 C  Resp: 16    Post vital signs: Reviewed and stable  Level of consciousness: awake, alert  and oriented  Complications: No apparent anesthesia complications

## 2011-05-05 NOTE — Anesthesia Preprocedure Evaluation (Signed)
Anesthesia Evaluation  Patient identified by MRN, date of birth, ID band Patient awake    Reviewed: Allergy & Precautions, H&P , NPO status , Patient's Chart, lab work & pertinent test results  Airway Mallampati: III  Neck ROM: full    Dental   Pulmonary sleep apnea , COPDCurrent Smoker,          Cardiovascular hypertension, + CAD and +CHF     Neuro/Psych    GI/Hepatic   Endo/Other  Diabetes mellitus-Morbid obesity  Renal/GU ESRFRenal disease     Musculoskeletal   Abdominal   Peds  Hematology   Anesthesia Other Findings   Reproductive/Obstetrics                           Anesthesia Physical Anesthesia Plan  ASA: IV  Anesthesia Plan: General   Post-op Pain Management:    Induction: Intravenous  Airway Management Planned: LMA  Additional Equipment:   Intra-op Plan:   Post-operative Plan:   Informed Consent: I have reviewed the patients History and Physical, chart, labs and discussed the procedure including the risks, benefits and alternatives for the proposed anesthesia with the patient or authorized representative who has indicated his/her understanding and acceptance.     Plan Discussed with: CRNA and Surgeon  Anesthesia Plan Comments:         Anesthesia Quick Evaluation

## 2011-05-05 NOTE — Transfer of Care (Signed)
Immediate Anesthesia Transfer of Care Note  Patient: Jon Russell  Procedure(s) Performed: Procedure(s) (LRB): ARTERIOVENOUS (AV) FISTULA CREATION (Left)  Patient Location: PACU  Anesthesia Type: General  Level of Consciousness: awake, alert  and patient cooperative  Airway & Oxygen Therapy: Patient Spontanous Breathing and Patient connected to nasal cannula oxygen  Post-op Assessment: Report given to PACU RN and Post -op Vital signs reviewed and stable  Post vital signs: Reviewed and stable  Complications: No apparent anesthesia complications

## 2011-05-05 NOTE — Interval H&P Note (Signed)
History and Physical Interval Note:  05/05/2011 7:39 AM  Jon Russell  has presented today for surgery, with the diagnosis of End Stage Renal Disease  The various methods of treatment have been discussed with the patient and family. After consideration of risks, benefits and other options for treatment, the patient has consented to  Procedure(s) (LRB): ARTERIOVENOUS (AV) FISTULA CREATION (Left) as a surgical intervention .  The patients' history has been reviewed, patient examined, no change in status, stable for surgery.  I have reviewed the patients' chart and labs.  Questions were answered to the patient's satisfaction.     FIELDS,CHARLES E

## 2011-05-05 NOTE — H&P (View-Only) (Signed)
[facility] [place] HISTORY AND PHYSICAL   History of Present Illness:  Patient is a 51 y.o. year old male who presents for placement of a permanent hemodialysis access. The patient is right handed .  The patient is not currently on hemodialysis.  The cause of renal failure is thought to be secondary to multiple factors.  Other chronic medical problems include coronary artery disease, COPD, diabetes, hypertension, hyperlipidemia.  These are all currently stable  Past Medical History  Diagnosis Date  . CHF (congestive heart failure)   . COPD (chronic obstructive pulmonary disease)   . Coronary artery disease   . Diabetes mellitus   . Hypertension   . Back pain   . Hyperlipemia   . Renal insufficiency   . Anemia     Past Surgical History  Procedure Date  . Eye surgery   . Lung biopsy      Social History History  Substance Use Topics  . Smoking status: Former Smoker -- 0.5 packs/day for 2 years    Types: Cigarettes    Quit date: 01/12/2006  . Smokeless tobacco: Never Used  . Alcohol Use: Yes     states he drinks 1/5 of liquor dailt- last drink 30 days ago    Family History Family History  Problem Relation Age of Onset  . Diabetes Mother   . Heart disease Mother   . Hyperlipidemia Mother   . Hypertension Mother   . Diabetes Father   . Heart disease Father   . Hyperlipidemia Father   . Hypertension Father   . Diabetes Sister   . Hyperlipidemia Sister   . Hypertension Sister   . Diabetes Brother   . Heart disease Brother   . Hyperlipidemia Brother   . Hypertension Brother   . Heart attack Brother   . Other Brother     Amputation    Allergies  No Known Allergies   Current Outpatient Prescriptions  Medication Sig Dispense Refill  . ARIPiprazole (ABILIFY) 5 MG tablet Take 1 tablet (5 mg total) by mouth daily.  30 tablet  1  . aspirin 81 MG EC tablet Take 81 mg by mouth daily.       Marland Kitchen atorvastatin (LIPITOR) 40 MG tablet TAKE 1 TABLET BY  MOUTH BY MOUTH EVERY DAY  30 tablet  0  . baclofen (LIORESAL) 20 MG tablet Take 20 mg by mouth at bedtime.       Marland Kitchen buPROPion (WELLBUTRIN SR) 150 MG 12 hr tablet Take 150 mg by mouth daily.       . carvedilol (COREG) 25 MG tablet Take 1 tablet (25 mg total) by mouth 2 (two) times daily with a meal.  60 tablet  6  . citalopram (CELEXA) 20 MG tablet Take 20 mg by mouth daily.        . cloNIDine (CATAPRES) 0.1 MG tablet Take 0.1 mg by mouth 2 (two) times daily.      Marland Kitchen dextran 70-hypromellose (TEARS RENEWED) ophthalmic solution Place 1 drop into both eyes 2 (two) times daily.       . diphenhydrAMINE (BENADRYL) 25 MG tablet Take 1 tablet (25 mg total) by mouth at bedtime as needed.  30 tablet  1  . dorzolamide-timolol (COSOPT) 22.3-6.8 MG/ML ophthalmic solution 1 drop 2 (two) times daily.        . hydrALAZINE (APRESOLINE) 100 MG tablet Take 1 tablet (100 mg total) by mouth 3 (three) times daily.  90 tablet  3  . insulin  aspart (NOVOLOG) 100 UNIT/ML injection Inject 8 Units into the skin 3 (three) times daily before meals. Sliding scale      . insulin detemir (LEVEMIR) 100 UNIT/ML injection Inject 30 Units into the skin 2 (two) times daily.      . isosorbide mononitrate (IMDUR) 60 MG 24 hr tablet Take 120 mg by mouth daily.        . metolazone (ZAROXOLYN) 2.5 MG tablet Take Sunday and Friday  15 tablet  3  . NIFEdipine (PROCARDIA XL/ADALAT-CC) 60 MG 24 hr tablet Take 60 mg by mouth 2 (two) times daily.      . pantoprazole (PROTONIX) 40 MG tablet Take 1 tablet (40 mg total) by mouth 2 (two) times daily.  60 tablet  1  . perphenazine (TRILAFON) 2 MG tablet Take 1 tablet (2 mg total) by mouth at bedtime.  30 tablet  1  . testosterone (ANDROGEL) 50 MG/5GM GEL Place 5 g onto the skin daily.      Marland Kitchen torsemide (DEMADEX) 20 MG tablet Take 2 tablets (40 mg total) by mouth 2 (two) times daily.  120 tablet  6    ROS:   General:  No weight loss, Fever, chills  HEENT: No recent headaches, no nasal bleeding, no  visual changes, no sore throat  Neurologic: No dizziness, blackouts, seizures. No recent symptoms of stroke or mini- stroke. No recent episodes of slurred speech, or temporary blindness.  Cardiac: No recent episodes of chest pain/pressure, no shortness of breath at rest.  + shortness of breath with exertion.  Denies history of atrial fibrillation or irregular heartbeat  Vascular: No history of rest pain in feet.  No history of claudication.  No history of non-healing ulcer, No history of DVT   Pulmonary: No home oxygen, no productive cough, no hemoptysis,  No asthma or wheezing  Musculoskeletal:  [ ]  Arthritis, [ ]  Low back pain,  [ ]  Joint pain  Hematologic:No history of hypercoagulable state.  No history of easy bleeding.  No history of anemia  Gastrointestinal: No hematochezia or melena,  No gastroesophageal reflux, no trouble swallowing  Urinary: [ ]  chronic Kidney disease, [ ]  on HD - [ ]  MWF or [ ]  TTHS, [ ]  Burning with urination, [ ]  Frequent urination, [ ]  Difficulty urinating;   Skin: No rashes  Psychological: No history of anxiety,  No history of depression   Physical Examination  Filed Vitals:   04/23/11 0959  BP: 119/65  Pulse: 59  Resp: 18  Height: 5\' 7"  (1.702 m)  Weight: 269 lb (122.018 kg)  SpO2: 98%    Body mass index is 42.13 kg/(m^2).  General:  Alert and oriented, no acute distress HEENT: Normal Neck: No bruit or JVD Pulmonary: Clear to auscultation bilaterally Cardiac: Regular Rate and Rhythm without murmur Gastrointestinal: Soft, non-tender, non-distended, no mass Skin: No rash Extremity Pulses:  2+ radial, brachial pulses bilaterally, obese arm Musculoskeletal: No deformity or edema  Neurologic: Upper and lower extremity motor 5/5 and symmetric  DATA: The patient had a vein mapping ultrasound today which I reviewed and interpreted.  Cephalic vein is greater than 3 mm in diameter throughout its course left side,  is greater than 3 mm in diameter  throughout its course on the right side is greater than 4 mm in diameter in the basilic right and left   ASSESSMENT: Needs hemodialysis access   PLAN:  I believe the best option for the patient will be a left radiocephalic AV fistula. The  vein is of good quality but may be slightly deep. I did discuss with the patient today risks benefits possible palpitations and procedure details including but not limited to bleeding infection non-maturation of the fistula possibility of other further intervention he understands and agrees to proceed.  His fistula is scheduled for 05/05/2011  Ruta Hinds, MD [facility] [place]: [phone] Pager: [phone]

## 2011-05-06 ENCOUNTER — Encounter (HOSPITAL_COMMUNITY): Payer: Self-pay | Admitting: Vascular Surgery

## 2011-05-18 ENCOUNTER — Encounter (HOSPITAL_COMMUNITY): Payer: Self-pay

## 2011-05-18 ENCOUNTER — Ambulatory Visit (HOSPITAL_COMMUNITY)
Admission: RE | Admit: 2011-05-18 | Discharge: 2011-05-18 | Disposition: A | Discharge status: Home / Self Care | Payer: Medicare Other | Source: Ambulatory Visit | Attending: Internal Medicine | Admitting: Internal Medicine

## 2011-05-18 VITALS — BP 144/60 | HR 70 | Wt 271.2 lb

## 2011-05-18 DIAGNOSIS — I509 Heart failure, unspecified: Secondary | ICD-10-CM

## 2011-05-18 DIAGNOSIS — J4489 Other specified chronic obstructive pulmonary disease: Secondary | ICD-10-CM | POA: Insufficient documentation

## 2011-05-18 DIAGNOSIS — H548 Legal blindness, as defined in USA: Secondary | ICD-10-CM | POA: Insufficient documentation

## 2011-05-18 DIAGNOSIS — J449 Chronic obstructive pulmonary disease, unspecified: Secondary | ICD-10-CM | POA: Insufficient documentation

## 2011-05-18 DIAGNOSIS — I2510 Atherosclerotic heart disease of native coronary artery without angina pectoris: Secondary | ICD-10-CM | POA: Insufficient documentation

## 2011-05-18 DIAGNOSIS — F1010 Alcohol abuse, uncomplicated: Secondary | ICD-10-CM | POA: Insufficient documentation

## 2011-05-18 DIAGNOSIS — I10 Essential (primary) hypertension: Secondary | ICD-10-CM

## 2011-05-18 DIAGNOSIS — E11319 Type 2 diabetes mellitus with unspecified diabetic retinopathy without macular edema: Secondary | ICD-10-CM | POA: Insufficient documentation

## 2011-05-18 DIAGNOSIS — E669 Obesity, unspecified: Secondary | ICD-10-CM | POA: Insufficient documentation

## 2011-05-18 DIAGNOSIS — Z7982 Long term (current) use of aspirin: Secondary | ICD-10-CM | POA: Insufficient documentation

## 2011-05-18 DIAGNOSIS — G4733 Obstructive sleep apnea (adult) (pediatric): Secondary | ICD-10-CM | POA: Insufficient documentation

## 2011-05-18 DIAGNOSIS — E1139 Type 2 diabetes mellitus with other diabetic ophthalmic complication: Secondary | ICD-10-CM | POA: Insufficient documentation

## 2011-05-18 DIAGNOSIS — Z794 Long term (current) use of insulin: Secondary | ICD-10-CM | POA: Insufficient documentation

## 2011-05-18 DIAGNOSIS — I5032 Chronic diastolic (congestive) heart failure: Secondary | ICD-10-CM

## 2011-05-18 DIAGNOSIS — F17200 Nicotine dependence, unspecified, uncomplicated: Secondary | ICD-10-CM | POA: Insufficient documentation

## 2011-05-18 DIAGNOSIS — N189 Chronic kidney disease, unspecified: Secondary | ICD-10-CM | POA: Insufficient documentation

## 2011-05-18 DIAGNOSIS — I129 Hypertensive chronic kidney disease with stage 1 through stage 4 chronic kidney disease, or unspecified chronic kidney disease: Secondary | ICD-10-CM | POA: Insufficient documentation

## 2011-05-18 MED ORDER — CARVEDILOL 25 MG PO TABS: 25.0000 mg | ORAL_TABLET | Freq: Two times a day (BID) | ORAL | Status: DC

## 2011-05-18 MED ORDER — HYDRALAZINE HCL 100 MG PO TABS: 100.0000 mg | ORAL_TABLET | Freq: Three times a day (TID) | ORAL | Status: DC

## 2011-05-18 MED ORDER — ISOSORBIDE MONONITRATE ER 60 MG PO TB24: 120.0000 mg | ORAL_TABLET | Freq: Every day | ORAL | Status: DC

## 2011-05-18 NOTE — Progress Notes (Signed)
Patient ID: Jon Russell, male   DOB: 07-09-1960, 51 y.o.   MRN: [id number]  HPI: Jon Russell is a 51 y/o male with multiple medical problems including obesity, HTN, DM2, COPD (with ongoing tobacco use), ETOH abuse, CRI (baseline Cr 1.8-2.0), OSA on CPAP and legal blindness (due to diabetic retinopathy).   Ischemic work up in th past included a stress test at [facility] for CP that was reportedly ok.   Admitted to [facility] [id number] with diastolic HF while attending ETOH detox program at [facility]. Echo EF 55-60% with pseudonormal filling pattern.   Had L wrist AVF placed 4/23.   Here for f/u. Weighing on most days weight 263-268. Says he is trying to do better with his diet. No ETOH. Feels good. Saw Lorrene Reid last month added Clonidine 0.1 mg bid. Does not have BP cuff. Takes BP at [employer]. SBP 130-140.  Takes metolazone every Thur and Sunday. Denies SOB/PND/Orthopnea. No edema.  SOB going up hills and stairs. He is wearing his CPAP at night.   Last Cr on 4/15 was 2.5 (stable)   ROS: All systems negative except as listed in HPI, PMH and Problem List.  Past Medical History  Diagnosis Date  . CHF (congestive heart failure)   . COPD (chronic obstructive pulmonary disease)   . Coronary artery disease   . Diabetes mellitus   . Back pain   . Hyperlipemia   . Anemia   . Hypertension     dr Contina Strain  . Renal insufficiency     not on dialysis    Current Outpatient Prescriptions  Medication Sig Dispense Refill  . ARIPiprazole (ABILIFY) 5 MG tablet Take 5 mg by mouth daily.      Marland Kitchen aspirin 81 MG EC tablet Take 81 mg by mouth daily.       Marland Kitchen atorvastatin (LIPITOR) 40 MG tablet Take 40 mg by mouth daily.      . baclofen (LIORESAL) 20 MG tablet Take 20 mg by mouth at bedtime.       Marland Kitchen buPROPion (WELLBUTRIN SR) 150 MG 12 hr tablet Take 150 mg by mouth daily.       . carvedilol (COREG) 25 MG tablet Take 1 tablet (25 mg total) by mouth 2 (two) times daily with a meal.  180 tablet  3  . citalopram (CELEXA) 20  MG tablet Take 20 mg by mouth daily.        . cloNIDine (CATAPRES) 0.1 MG tablet Take 0.1 mg by mouth 2 (two) times daily.      . diphenhydrAMINE (BENADRYL) 25 MG tablet Take 25 mg by mouth at bedtime as needed. For sleep      . dorzolamide-timolol (COSOPT) 22.3-6.8 MG/ML ophthalmic solution Place 1 drop into both eyes 2 (two) times daily.       . hydrALAZINE (APRESOLINE) 100 MG tablet Take 1 tablet (100 mg total) by mouth 3 (three) times daily.  270 tablet  3  . insulin aspart (NOVOLOG) 100 UNIT/ML injection Inject 2-15 Units into the skin 3 (three) times daily before meals. Sliding scale      . insulin detemir (LEVEMIR) 100 UNIT/ML injection Inject 30 Units into the skin 2 (two) times daily.      . isosorbide mononitrate (IMDUR) 60 MG 24 hr tablet Take 2 tablets (120 mg total) by mouth daily.  180 tablet  3  . metolazone (ZAROXOLYN) 2.5 MG tablet Take 2.5 mg by mouth 2 (two) times a week. Take Sunday and thursday      .  NIFEdipine (PROCARDIA XL/ADALAT-CC) 60 MG 24 hr tablet Take 60 mg by mouth 2 (two) times daily.      . pantoprazole (PROTONIX) 40 MG tablet Take 40 mg by mouth 2 (two) times daily.      Marland Kitchen perphenazine (TRILAFON) 2 MG tablet Take 2 mg by mouth at bedtime.      Marland Kitchen testosterone (ANDROGEL) 50 MG/5GM GEL Place 5 g onto the skin daily.      Marland Kitchen torsemide (DEMADEX) 20 MG tablet Take 40 mg by mouth 2 (two) times daily.      Marland Kitchen DISCONTD: carvedilol (COREG) 25 MG tablet Take 25 mg by mouth 2 (two) times daily with a meal.      . DISCONTD: hydrALAZINE (APRESOLINE) 100 MG tablet Take 100 mg by mouth 3 (three) times daily.      Marland Kitchen DISCONTD: isosorbide mononitrate (IMDUR) 60 MG 24 hr tablet Take 120 mg by mouth daily.          PHYSICAL EXAM: Filed Vitals:   05/18/11 1118  BP: 144/60  Pulse: 70   Weight change:  271 (269) General:  Well appearing. No resp difficulty HEENT: normal Neck: supple. JVP 6  Carotids 2+ bilaterally; no bruits. No lymphadenopathy or thryomegaly appreciated. Cor:  PMI normal. Regular rate & rhythm. No rubs, gallops or murmurs. Lungs: clear Abdomen: soft, nontender, distended. No hepatosplenomegaly. No bruits or masses. Good bowel sounds. Extremities: no cyanosis, clubbing, rash, no edema. LUE AVF with thrill  Neuro: alert & orientedx3, cranial nerves grossly intact. Moves all 4 extremities w/o difficulty. Affect pleasant.      ASSESSMENT & PLAN:

## 2011-05-18 NOTE — Assessment & Plan Note (Signed)
Improved control. Continue to follow. Can increase clonidine to 0.2 bid as needed to SBP less than 140.

## 2011-05-18 NOTE — Assessment & Plan Note (Signed)
Doing well. Continue current regimen. Reinforced need for daily weights and reviewed use of sliding scale diuretics. Reminded him to remain vigilant about his efforts to control his BP and HF. Will check BMET today.

## 2011-06-04 ENCOUNTER — Ambulatory Visit: Payer: Medicare Other | Admitting: Vascular Surgery

## 2011-06-10 ENCOUNTER — Encounter: Payer: Self-pay | Admitting: Vascular Surgery

## 2011-06-11 ENCOUNTER — Encounter: Payer: Self-pay | Admitting: Vascular Surgery

## 2011-06-11 ENCOUNTER — Ambulatory Visit (INDEPENDENT_AMBULATORY_CARE_PROVIDER_SITE_OTHER): Payer: Medicare Other | Admitting: Vascular Surgery

## 2011-06-11 VITALS — BP 119/62 | HR 67 | Temp 97.9°F | Ht 67.0 in | Wt 266.0 lb

## 2011-06-11 DIAGNOSIS — N186 End stage renal disease: Secondary | ICD-10-CM

## 2011-06-11 NOTE — Progress Notes (Signed)
Patient is a 51 year old male who returns for followup today after placement of a left radiocephalic AV fistula on [id number]. He is currently not on dialysis. He denies any numbness or tingling in the hand.  Physical exam:  Filed Vitals:   06/11/11 0856  BP: 119/62  Pulse: 67  Temp: 97.9 F (36.6 C)  TempSrc: Oral  Height: 5\' 7"  (1.702 m)  Weight: 266 lb (120.657 kg)    Palpable thrill left arm AV fistula 1 mm area of separation at the distal aspect of the incision, vein is slightly difficult to palpate  Assessment: Patent left radiocephalic AV fistula Plan: The patient will wash the incision daily with soap and water. He will start exercising the fistula. He'll return for a followup duplex ultrasound in one month.  Ruta Hinds, MD [facility] Office: [phone] Pager: [phone]

## 2011-06-22 ENCOUNTER — Ambulatory Visit (HOSPITAL_COMMUNITY): Admission: RE | Admit: 2011-06-22 | Payer: Medicare Other | Source: Ambulatory Visit

## 2011-06-30 ENCOUNTER — Ambulatory Visit (HOSPITAL_COMMUNITY)
Admission: RE | Admit: 2011-06-30 | Discharge: 2011-06-30 | Disposition: A | Discharge status: Home / Self Care | Payer: Medicare Other | Source: Ambulatory Visit | Attending: Internal Medicine | Admitting: Internal Medicine

## 2011-06-30 ENCOUNTER — Encounter (HOSPITAL_COMMUNITY): Payer: Self-pay

## 2011-06-30 VITALS — BP 142/60 | HR 73 | Ht 67.0 in | Wt 274.8 lb

## 2011-06-30 DIAGNOSIS — I5032 Chronic diastolic (congestive) heart failure: Secondary | ICD-10-CM | POA: Insufficient documentation

## 2011-06-30 DIAGNOSIS — N189 Chronic kidney disease, unspecified: Secondary | ICD-10-CM

## 2011-06-30 NOTE — Patient Instructions (Addendum)
Take metolazone 2.5 mg today then return to regular schedule.  Follow up 6 weeks.

## 2011-06-30 NOTE — Progress Notes (Signed)
HPI:  Jon Russell is a 51 y/o male with multiple medical problems including obesity, HTN, DM2, COPD (with ongoing tobacco use), ETOH abuse, CRI (baseline Cr 1.8-2.0), OSA on CPAP and legal blindness (due to diabetic retinopathy).   Ischemic work up in th past included a stress test at [facility] for CP that was reportedly ok.   Admitted to [facility] [id number] with diastolic HF while attending ETOH detox program at [facility]. Echo EF 55-60% with pseudonormal filling pattern.   Had L wrist AVF placed 4/23.   Here for f/u.  He feels pretty good today.  Weight at home 260-268.  Up 8 pounds over the last week.  No ETOH.   Starts Heart Stride at [facility] next week.  +nonproductive cough.  Mild SOB.  Using CPAP at night, a couple of nights with PND but not last night.  Mild edema. SOB going up hills and stairs.     ROS: All systems negative except as listed in HPI, PMH and Problem List.  Past Medical History  Diagnosis Date  . CHF (congestive heart failure)   . COPD (chronic obstructive pulmonary disease)   . Coronary artery disease   . Diabetes mellitus   . Back pain   . Hyperlipemia   . Anemia   . Hypertension     dr bensimhon  . Renal insufficiency     not on dialysis    Current Outpatient Prescriptions  Medication Sig Dispense Refill  . ARIPiprazole (ABILIFY) 5 MG tablet Take 5 mg by mouth daily.      Marland Kitchen aspirin 81 MG EC tablet Take 81 mg by mouth daily.       Marland Kitchen atorvastatin (LIPITOR) 40 MG tablet Take 40 mg by mouth daily.      . baclofen (LIORESAL) 20 MG tablet Take 20 mg by mouth at bedtime.       Marland Kitchen buPROPion (WELLBUTRIN SR) 150 MG 12 hr tablet Take 150 mg by mouth daily.       . carvedilol (COREG) 25 MG tablet Take 1 tablet (25 mg total) by mouth 2 (two) times daily with a meal.  180 tablet  3  . citalopram (CELEXA) 20 MG tablet Take 20 mg by mouth daily.        . cloNIDine (CATAPRES) 0.1 MG tablet Take 0.1 mg by mouth 2 (two) times daily.      . diphenhydrAMINE (BENADRYL) 25 MG  tablet Take 25 mg by mouth at bedtime as needed. For sleep      . dorzolamide-timolol (COSOPT) 22.3-6.8 MG/ML ophthalmic solution Place 1 drop into both eyes 2 (two) times daily.       . hydrALAZINE (APRESOLINE) 100 MG tablet Take 1 tablet (100 mg total) by mouth 3 (three) times daily.  270 tablet  3  . insulin aspart (NOVOLOG) 100 UNIT/ML injection Inject 2-15 Units into the skin 3 (three) times daily before meals. Sliding scale      . insulin detemir (LEVEMIR) 100 UNIT/ML injection Inject 30 Units into the skin 2 (two) times daily.      . isosorbide mononitrate (IMDUR) 60 MG 24 hr tablet Take 2 tablets (120 mg total) by mouth daily.  180 tablet  3  . metolazone (ZAROXOLYN) 2.5 MG tablet Take 2.5 mg by mouth 2 (two) times a week. Take Sunday and thursday      . NIFEdipine (PROCARDIA XL/ADALAT-CC) 60 MG 24 hr tablet Take 60 mg by mouth 2 (two) times daily.      Marland Kitchen  pantoprazole (PROTONIX) 40 MG tablet Take 40 mg by mouth 2 (two) times daily.      Marland Kitchen perphenazine (TRILAFON) 2 MG tablet Take 2 mg by mouth at bedtime.      Marland Kitchen testosterone (ANDROGEL) 50 MG/5GM GEL Place 5 g onto the skin daily.      Marland Kitchen torsemide (DEMADEX) 20 MG tablet Take 40 mg by mouth 2 (two) times daily.         PHYSICAL EXAM: Filed Vitals:   06/30/11 1149  BP: 142/60  Pulse: 73  Height: 5\' 7"  (1.702 m)  Weight: 274 lb 12.8 oz (124.648 kg)  SpO2: 94%    General:  Well appearing. No resp difficulty HEENT: normal Neck: supple. JVP 7-8. Carotids 2+ bilaterally; no bruits. No lymphadenopathy or thryomegaly appreciated. Cor: PMI normal. Regular rate & rhythm. No rubs, gallops or murmurs. Lungs: clear Abdomen: soft, nontender, distended. No hepatosplenomegaly. No bruits or masses. Good bowel sounds. Extremities: no cyanosis, clubbing, rash, trace edema. LUE AVF with thrill  Neuro: alert & orientedx3, cranial nerves grossly intact. Moves all 4 extremities w/o difficulty. Affect pleasant.    ASSESSMENT & PLAN:

## 2011-06-30 NOTE — Assessment & Plan Note (Signed)
Volume status mildly elevated today with weight gain of 8 pounds due to dietary indiscretions.  NYHA II-III. Will have him take an extra 1 dose of metolazone 2.5 mg today then resume previous therapy.  Have discussed use of sliding scale diuretics and he states he will watch weight closely especially in setting of dietary indiscretions.  If his weight is not returning to baseline he is to call the clinic, he voiced understanding.

## 2011-06-30 NOTE — Assessment & Plan Note (Signed)
Continue follow up with Dr. Lorrene Reid.

## 2011-07-22 ENCOUNTER — Encounter: Payer: Self-pay | Admitting: Neurosurgery

## 2011-07-23 ENCOUNTER — Ambulatory Visit: Payer: Medicare Other | Admitting: Vascular Surgery

## 2011-07-23 ENCOUNTER — Ambulatory Visit: Payer: Medicare Other | Admitting: Neurosurgery

## 2011-08-04 ENCOUNTER — Telehealth (HOSPITAL_COMMUNITY): Payer: Self-pay | Admitting: *Deleted

## 2011-08-04 MED ORDER — METOLAZONE 2.5 MG PO TABS: ORAL_TABLET | ORAL | Status: DC

## 2011-08-04 NOTE — Telephone Encounter (Signed)
Spoke w/pt he states he was admitted to [facility] on wed 7/17 due to cbg 739 and they also told him his kidney function was elevated, he went in weighing 261 and was d/c'd Sun 7/21 weighing 284, he states they did not change his meds he is on torsemide 40 mg bid, he is SOB and has edema in legs and abd, he is out of metolazone, per Dr Haroldine Laws give pt metolazone 2.5 mg everyday and see him in clinic on Friday, pt is aware and appt scheduled for Fri at 9 am prescription for torsemide sent into pharmacy he states he can get it today

## 2011-08-04 NOTE — Telephone Encounter (Signed)
Patient states that he has gained apx 20 lbs in a 5 day period.  Please call patient

## 2011-08-07 ENCOUNTER — Encounter (HOSPITAL_COMMUNITY): Payer: Self-pay

## 2011-08-07 ENCOUNTER — Ambulatory Visit (HOSPITAL_BASED_OUTPATIENT_CLINIC_OR_DEPARTMENT_OTHER)
Admission: RE | Admit: 2011-08-07 | Discharge: 2011-08-07 | Disposition: A | Discharge status: Home / Self Care | Payer: Medicare Other | Source: Ambulatory Visit | Attending: Internal Medicine | Admitting: Internal Medicine

## 2011-08-07 ENCOUNTER — Inpatient Hospital Stay (HOSPITAL_COMMUNITY)
Admission: AD | Admit: 2011-08-07 | Discharge: 2011-08-09 | DRG: 291 | Disposition: A | Discharge status: Home Health | Payer: Medicare Other | Source: Ambulatory Visit | Attending: Internal Medicine | Admitting: Internal Medicine

## 2011-08-07 ENCOUNTER — Encounter (HOSPITAL_COMMUNITY): Payer: Self-pay | Admitting: General Practice

## 2011-08-07 VITALS — BP 132/70 | HR 70 | Resp 16 | Ht 67.0 in | Wt 282.0 lb

## 2011-08-07 DIAGNOSIS — J9621 Acute and chronic respiratory failure with hypoxia: Secondary | ICD-10-CM

## 2011-08-07 DIAGNOSIS — E11319 Type 2 diabetes mellitus with unspecified diabetic retinopathy without macular edema: Secondary | ICD-10-CM | POA: Diagnosis present

## 2011-08-07 DIAGNOSIS — J4489 Other specified chronic obstructive pulmonary disease: Secondary | ICD-10-CM | POA: Diagnosis present

## 2011-08-07 DIAGNOSIS — I129 Hypertensive chronic kidney disease with stage 1 through stage 4 chronic kidney disease, or unspecified chronic kidney disease: Secondary | ICD-10-CM | POA: Diagnosis present

## 2011-08-07 DIAGNOSIS — F1020 Alcohol dependence, uncomplicated: Secondary | ICD-10-CM | POA: Diagnosis present

## 2011-08-07 DIAGNOSIS — I509 Heart failure, unspecified: Secondary | ICD-10-CM

## 2011-08-07 DIAGNOSIS — G4733 Obstructive sleep apnea (adult) (pediatric): Secondary | ICD-10-CM | POA: Diagnosis present

## 2011-08-07 DIAGNOSIS — J449 Chronic obstructive pulmonary disease, unspecified: Secondary | ICD-10-CM

## 2011-08-07 DIAGNOSIS — I10 Essential (primary) hypertension: Secondary | ICD-10-CM | POA: Diagnosis present

## 2011-08-07 DIAGNOSIS — E7800 Pure hypercholesterolemia, unspecified: Secondary | ICD-10-CM | POA: Diagnosis present

## 2011-08-07 DIAGNOSIS — Z79899 Other long term (current) drug therapy: Secondary | ICD-10-CM

## 2011-08-07 DIAGNOSIS — Z794 Long term (current) use of insulin: Secondary | ICD-10-CM

## 2011-08-07 DIAGNOSIS — N179 Acute kidney failure, unspecified: Secondary | ICD-10-CM | POA: Diagnosis present

## 2011-08-07 DIAGNOSIS — K219 Gastro-esophageal reflux disease without esophagitis: Secondary | ICD-10-CM | POA: Diagnosis present

## 2011-08-07 DIAGNOSIS — N189 Chronic kidney disease, unspecified: Secondary | ICD-10-CM | POA: Diagnosis present

## 2011-08-07 DIAGNOSIS — E1139 Type 2 diabetes mellitus with other diabetic ophthalmic complication: Secondary | ICD-10-CM | POA: Diagnosis present

## 2011-08-07 DIAGNOSIS — E119 Type 2 diabetes mellitus without complications: Secondary | ICD-10-CM | POA: Diagnosis present

## 2011-08-07 DIAGNOSIS — Z720 Tobacco use: Secondary | ICD-10-CM

## 2011-08-07 DIAGNOSIS — F17200 Nicotine dependence, unspecified, uncomplicated: Secondary | ICD-10-CM | POA: Diagnosis present

## 2011-08-07 DIAGNOSIS — I5033 Acute on chronic diastolic (congestive) heart failure: Secondary | ICD-10-CM

## 2011-08-07 DIAGNOSIS — H548 Legal blindness, as defined in USA: Secondary | ICD-10-CM | POA: Diagnosis present

## 2011-08-07 DIAGNOSIS — J9620 Acute and chronic respiratory failure, unspecified whether with hypoxia or hypercapnia: Secondary | ICD-10-CM | POA: Diagnosis present

## 2011-08-07 DIAGNOSIS — Z7982 Long term (current) use of aspirin: Secondary | ICD-10-CM

## 2011-08-07 HISTORY — DX: Sleep apnea, unspecified: G47.30

## 2011-08-07 HISTORY — DX: Angina pectoris, unspecified: I20.9

## 2011-08-07 HISTORY — DX: Shortness of breath: R06.02

## 2011-08-07 HISTORY — DX: Major depressive disorder, single episode, unspecified: F32.9

## 2011-08-07 HISTORY — DX: Depression, unspecified: F32.A

## 2011-08-07 HISTORY — DX: Gastro-esophageal reflux disease without esophagitis: K21.9

## 2011-08-07 HISTORY — DX: Chronic diastolic (congestive) heart failure: I50.32

## 2011-08-07 HISTORY — DX: Type 2 diabetes mellitus with unspecified diabetic retinopathy without macular edema: E11.319

## 2011-08-07 LAB — GLUCOSE, CAPILLARY: Glucose-Capillary: 139 mg/dL — ABNORMAL HIGH (ref 70–99)

## 2011-08-07 LAB — COMPREHENSIVE METABOLIC PANEL
ALT: 32 U/L (ref 0–53)
AST: 22 U/L (ref 0–37)
CO2: 24 mEq/L (ref 19–32)
Calcium: 8.8 mg/dL (ref 8.4–10.5)
GFR calc non Af Amer: 31 mL/min — ABNORMAL LOW (ref >90)
Potassium: 4.2 mEq/L (ref 3.5–5.1)
Sodium: 139 mEq/L (ref 135–145)
Total Protein: 6.7 g/dL (ref 6.0–8.3)

## 2011-08-07 LAB — CBC
HCT: 29.6 % — ABNORMAL LOW (ref 39.0–52.0)
Hemoglobin: 9.7 g/dL — ABNORMAL LOW (ref 13.0–17.0)
MCH: 29.9 pg (ref 26.0–34.0)
MCHC: 32.8 g/dL (ref 30.0–36.0)

## 2011-08-07 LAB — PRO B NATRIURETIC PEPTIDE: Pro B Natriuretic peptide (BNP): 1015 pg/mL — ABNORMAL HIGH (ref 0–125)

## 2011-08-07 MED ORDER — BUPROPION HCL ER (XL) 150 MG PO TB24
150.0000 mg | ORAL_TABLET | Freq: Every day | ORAL | Status: DC
Start: 2011-08-07 — End: 2011-08-09
  Administered 2011-08-07 – 2011-08-09 (×3): 150 mg via ORAL
  Filled 2011-08-07 (×3): qty 1

## 2011-08-07 MED ORDER — CLONIDINE HCL 0.1 MG PO TABS
0.1000 mg | ORAL_TABLET | Freq: Two times a day (BID) | ORAL | Status: DC
  Administered 2011-08-07 – 2011-08-09 (×4): 0.1 mg via ORAL
  Filled 2011-08-07 (×6): qty 1

## 2011-08-07 MED ORDER — CITALOPRAM HYDROBROMIDE 20 MG PO TABS
20.0000 mg | ORAL_TABLET | Freq: Every day | ORAL | Status: DC
  Administered 2011-08-08 – 2011-08-09 (×2): 20 mg via ORAL
  Filled 2011-08-07 (×3): qty 1

## 2011-08-07 MED ORDER — ARIPIPRAZOLE 5 MG PO TABS
5.0000 mg | ORAL_TABLET | Freq: Every day | ORAL | Status: DC
  Administered 2011-08-07 – 2011-08-09 (×3): 5 mg via ORAL
  Filled 2011-08-07 (×3): qty 1

## 2011-08-07 MED ORDER — ASPIRIN EC 81 MG PO TBEC
81.0000 mg | DELAYED_RELEASE_TABLET | Freq: Every day | ORAL | Status: DC
  Administered 2011-08-07 – 2011-08-09 (×3): 81 mg via ORAL
  Filled 2011-08-07 (×3): qty 1

## 2011-08-07 MED ORDER — SODIUM CHLORIDE 0.9 % IJ SOLN
3.0000 mL | Freq: Two times a day (BID) | INTRAMUSCULAR | Status: DC
  Administered 2011-08-07 – 2011-08-09 (×4): 3 mL via INTRAVENOUS

## 2011-08-07 MED ORDER — PERPHENAZINE 2 MG PO TABS
2.0000 mg | ORAL_TABLET | Freq: Every day | ORAL | Status: DC
  Administered 2011-08-07 – 2011-08-08 (×2): 2 mg via ORAL
  Filled 2011-08-07 (×3): qty 1

## 2011-08-07 MED ORDER — ENOXAPARIN SODIUM 30 MG/0.3ML ~~LOC~~ SOLN: 30.0000 mg | SUBCUTANEOUS | Status: DC

## 2011-08-07 MED ORDER — CARVEDILOL 25 MG PO TABS
25.0000 mg | ORAL_TABLET | Freq: Two times a day (BID) | ORAL | Status: DC
  Administered 2011-08-07 – 2011-08-09 (×4): 25 mg via ORAL
  Filled 2011-08-07 (×6): qty 1

## 2011-08-07 MED ORDER — ONDANSETRON HCL 4 MG/2ML IJ SOLN: 4.0000 mg | Freq: Four times a day (QID) | INTRAMUSCULAR | Status: DC | PRN

## 2011-08-07 MED ORDER — INSULIN GLARGINE 100 UNIT/ML ~~LOC~~ SOLN
30.0000 [IU] | Freq: Two times a day (BID) | SUBCUTANEOUS | Status: DC
  Administered 2011-08-07 – 2011-08-09 (×4): 30 [IU] via SUBCUTANEOUS

## 2011-08-07 MED ORDER — INSULIN ASPART 100 UNIT/ML ~~LOC~~ SOLN: 0.0000 [IU] | Freq: Every day | SUBCUTANEOUS | Status: DC

## 2011-08-07 MED ORDER — ATORVASTATIN CALCIUM 40 MG PO TABS
40.0000 mg | ORAL_TABLET | Freq: Every day | ORAL | Status: DC
  Administered 2011-08-07 – 2011-08-08 (×2): 40 mg via ORAL
  Filled 2011-08-07 (×3): qty 1

## 2011-08-07 MED ORDER — INSULIN ASPART 100 UNIT/ML ~~LOC~~ SOLN
0.0000 [IU] | Freq: Three times a day (TID) | SUBCUTANEOUS | Status: DC
  Administered 2011-08-07 – 2011-08-08 (×2): 7 [IU] via SUBCUTANEOUS
  Administered 2011-08-08: 3 [IU] via SUBCUTANEOUS
  Administered 2011-08-09: 4 [IU] via SUBCUTANEOUS

## 2011-08-07 MED ORDER — SODIUM CHLORIDE 0.9 % IJ SOLN: 3.0000 mL | INTRAMUSCULAR | Status: DC | PRN

## 2011-08-07 MED ORDER — SODIUM CHLORIDE 0.9 % IV SOLN: 250.0000 mL | INTRAVENOUS | Status: DC | PRN

## 2011-08-07 MED ORDER — ACETAMINOPHEN 325 MG PO TABS: 650.0000 mg | ORAL_TABLET | ORAL | Status: DC | PRN

## 2011-08-07 MED ORDER — ENOXAPARIN SODIUM 60 MG/0.6ML ~~LOC~~ SOLN
60.0000 mg | Freq: Every day | SUBCUTANEOUS | Status: DC
  Administered 2011-08-07 – 2011-08-09 (×3): 60 mg via SUBCUTANEOUS
  Filled 2011-08-07 (×3): qty 0.6

## 2011-08-07 MED ORDER — ISOSORBIDE MONONITRATE ER 60 MG PO TB24
120.0000 mg | ORAL_TABLET | Freq: Every day | ORAL | Status: DC
Start: 2011-08-07 — End: 2011-08-09
  Administered 2011-08-08 – 2011-08-09 (×2): 120 mg via ORAL
  Filled 2011-08-07 (×3): qty 2

## 2011-08-07 MED ORDER — PANTOPRAZOLE SODIUM 40 MG PO TBEC
40.0000 mg | DELAYED_RELEASE_TABLET | Freq: Every day | ORAL | Status: DC
  Administered 2011-08-07 – 2011-08-09 (×3): 40 mg via ORAL
  Filled 2011-08-07: qty 1

## 2011-08-07 MED ORDER — HYDRALAZINE HCL 50 MG PO TABS
100.0000 mg | ORAL_TABLET | Freq: Three times a day (TID) | ORAL | Status: DC
  Administered 2011-08-07 – 2011-08-09 (×7): 100 mg via ORAL
  Filled 2011-08-07 (×9): qty 2

## 2011-08-07 MED ORDER — BACLOFEN 20 MG PO TABS
20.0000 mg | ORAL_TABLET | Freq: Every day | ORAL | Status: DC
  Administered 2011-08-07 – 2011-08-08 (×2): 20 mg via ORAL
  Filled 2011-08-07 (×3): qty 1

## 2011-08-07 MED ORDER — TESTOSTERONE 50 MG/5GM (1%) TD GEL
5.0000 g | Freq: Every day | TRANSDERMAL | Status: DC
  Administered 2011-08-08 – 2011-08-09 (×2): 5 g via TRANSDERMAL

## 2011-08-07 MED ORDER — NIFEDIPINE ER 60 MG PO TB24
60.0000 mg | ORAL_TABLET | Freq: Two times a day (BID) | ORAL | Status: DC
  Administered 2011-08-07 – 2011-08-09 (×4): 60 mg via ORAL
  Filled 2011-08-07 (×6): qty 1

## 2011-08-07 MED ORDER — FUROSEMIDE 10 MG/ML IJ SOLN
80.0000 mg | Freq: Two times a day (BID) | INTRAMUSCULAR | Status: DC
  Administered 2011-08-07 – 2011-08-09 (×4): 80 mg via INTRAVENOUS
  Filled 2011-08-07 (×6): qty 8

## 2011-08-07 MED ORDER — DORZOLAMIDE HCL-TIMOLOL MAL 2-0.5 % OP SOLN
1.0000 [drp] | Freq: Two times a day (BID) | OPHTHALMIC | Status: DC
  Administered 2011-08-07 – 2011-08-09 (×5): 1 [drp] via OPHTHALMIC
  Filled 2011-08-07: qty 10

## 2011-08-07 NOTE — Progress Notes (Signed)
Utilization Review Completed.  

## 2011-08-07 NOTE — Progress Notes (Signed)
Patient ID: Jon Russell, male   DOB: Dec 08, 1960, 51 y.o.   MRN: [id number] HPI: Jon Russell is a 51 y/o male with multiple medical problems including obesity, HTN, DM2, COPD (with ongoing tobacco use), ETOH abuse, CRI (baseline Cr 1.8-2.0), OSA on CPAP and legal blindness (due to diabetic retinopathy).   Ischemic work up in th past included a stress test at [facility] for CP that was reportedly ok.   Admitted to [facility] [id number] with diastolic HF while attending ETOH detox program at [facility]. Echo EF 55-60% with pseudonormal filling pattern.   Had L wrist AVF placed 4/23.   Discharge from [facility] 08/03/11 after being for hyperglycemia. Weight up 23 pounds.   He returns for follow up. Received  Metolazone 2.5 mg twice a day for the last 4 days due to 23 pound weight gain. Weight at home only down 3 pounds. Increased cough. SOB with exertion. + Orthopnea.  Limiting fluid intake.     ROS: All systems negative except as listed in HPI, PMH and Problem List.  Past Medical History  Diagnosis Date  . CHF (congestive heart failure)   . COPD (chronic obstructive pulmonary disease)   . Coronary artery disease   . Diabetes mellitus   . Back pain   . Hyperlipemia   . Anemia   . Hypertension     dr Elleen Russell  . Renal insufficiency     not on dialysis    Current Outpatient Prescriptions  Medication Sig Dispense Refill  . ARIPiprazole (ABILIFY) 5 MG tablet Take 5 mg by mouth daily.      Marland Kitchen aspirin 81 MG EC tablet Take 81 mg by mouth daily.       Marland Kitchen atorvastatin (LIPITOR) 40 MG tablet Take 40 mg by mouth daily.      . baclofen (LIORESAL) 20 MG tablet Take 20 mg by mouth at bedtime.       Marland Kitchen buPROPion (WELLBUTRIN SR) 150 MG 12 hr tablet Take 150 mg by mouth daily.       . carvedilol (COREG) 25 MG tablet Take 1 tablet (25 mg total) by mouth 2 (two) times daily with a meal.  180 tablet  3  . citalopram (CELEXA) 20 MG tablet Take 20 mg by mouth daily.        . cloNIDine (CATAPRES) 0.1 MG tablet  Take 0.1 mg by mouth 2 (two) times daily.      . diphenhydrAMINE (BENADRYL) 25 MG tablet Take 25 mg by mouth at bedtime as needed. For sleep      . dorzolamide-timolol (COSOPT) 22.3-6.8 MG/ML ophthalmic solution Place 1 drop into both eyes 2 (two) times daily.       . hydrALAZINE (APRESOLINE) 100 MG tablet Take 1 tablet (100 mg total) by mouth 3 (three) times daily.  270 tablet  3  . insulin aspart (NOVOLOG) 100 UNIT/ML injection Inject 2-15 Units into the skin 3 (three) times daily before meals. Sliding scale      . insulin detemir (LEVEMIR) 100 UNIT/ML injection Inject 30 Units into the skin 2 (two) times daily.      . isosorbide mononitrate (IMDUR) 60 MG 24 hr tablet Take 2 tablets (120 mg total) by mouth daily.  180 tablet  3  . metolazone (ZAROXOLYN) 2.5 MG tablet Take 1 tab As directed  10 tablet  3  . NIFEdipine (PROCARDIA XL/ADALAT-CC) 60 MG 24 hr tablet Take 60 mg by mouth 2 (two) times daily.      Marland Kitchen  pantoprazole (PROTONIX) 40 MG tablet Take 40 mg by mouth 2 (two) times daily.      Marland Kitchen perphenazine (TRILAFON) 2 MG tablet Take 2 mg by mouth at bedtime.      Marland Kitchen testosterone (ANDROGEL) 50 MG/5GM GEL Place 5 g onto the skin daily.      Marland Kitchen torsemide (DEMADEX) 20 MG tablet Take 40 mg by mouth 2 (two) times daily.         PHYSICAL EXAM: Filed Vitals:   08/07/11 0903  BP: 132/70  Pulse: 70  Resp: 16  Height: 5\' 7"  (1.702 m)  Weight: 282 lb (127.914 kg)  SpO2: 95%    General:  Chronically ill appearing. No resp difficulty HEENT: normal Neck: supple. JVP to ear Carotids 2+ bilaterally; no bruits. No lymphadenopathy or thryomegaly appreciated. Cor: PMI normal. Regular rate & rhythm. No rubs, gallops or murmurs. Lungs: clear Abdomen: soft, nontender, distended. No hepatosplenomegaly. No bruits or masses. Good bowel sounds. Extremities: no cyanosis, clubbing, rash, RLE LLE 1+ edema. LUE AVF with thrill  Neuro: alert & orientedx3, cranial nerves grossly intact. Moves all 4 extremities w/o  difficulty. Affect pleasant.    ASSESSMENT & PLAN:

## 2011-08-07 NOTE — Assessment & Plan Note (Signed)
Weight up at least 10 pounds. Not responding to home metolazone. Renal function tenuous. Will admit for a day or two for IV diuresis and to watch renal function closely.

## 2011-08-07 NOTE — H&P (Signed)
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 Detox program completed in November , 2012. Previous ischemic work up at [facility] was ok. He has been followed by Heart Failure Clinic at [facility].   Recently discharged from [facility] 08/03/11 after being treated for hyperglycemia. While hospitalized he reported a 23 pound weight. Prior to admit his weight was 261 pounds. On 08/04/11 he contacted the HF clinic due to 23 pound weight gain. He started taking Metolazone 2.5 mg bid and Torsemide 40 mg bid but his weight only went down 3 pounds. He reports poor urine output.   He returned today for acute work in at the [facility] today with increased dyspnea with exertion, +orthopnea, cough, and lower extremity edema.   He does live alone and receives help from his son. He continues to smoke 1 pack of cigarettes per day. Remains alcohol free.     Review of Systems:     Cardiac Review of Systems: {Y] = yes [ ]  = no  Chest Pain [    ]  Resting SOB [   ] Exertional SOB  [ Y ]  Orthopnea [ Y ]   Pedal Edema [  Y ]    Palpitations [  ] Syncope  [  ]   Presyncope [   ]  General Review of Systems: [Y] = yes [  ]=no Constitional: recent weight change [  ]; anorexia [  ]; fatigue [  ]; nausea [  ]; night sweats [  ]; fever [  ]; or chills [  ];                                                                                                                                          Dental: poor dentition[  ]; Last Dentist visit:   Eye : blurred vision [  ]; diplopia [   ]; vision changes [  ];  Amaurosis fugax[  ]; Resp: cough [  ];  wheezing[  ];  hemoptysis[  ]; shortness of breath[  Y]; paroxysmal nocturnal dyspnea[  ]; dyspnea on exertion[ Y ]; or orthopnea[Y  ];   GI:  gallstones[  ], vomiting[  ];  dysphagia[  ]; melena[  ];  hematochezia [  ]; heartburn[  ];   Hx of  Colonoscopy[  ]; GU: kidney stones [  ]; hematuria[  ];   dysuria [  ];  nocturia[  ];  history of     obstruction [  ];                 Skin: rash, swelling[Y  ];, hair loss[  ];  peripheral edema[  ];  or itching[  ]; Musculosketetal: myalgias[  ];  joint swelling[  ];  joint erythema[  ];  joint pain[  ];  back pain[  ];  Heme/Lymph: bruising[  ];  bleeding[  ];  anemia[  ];  Neuro: TIA[  ];  headaches[  ];  stroke[  ];  vertigo[  ];  seizures[  ];   paresthesias[  ];  difficulty walking[  ];  Psych:depression[  ]; anxiety[  ];  Endocrine: diabetes[Y  ];  thyroid dysfunction[  ];  Immunizations: Flu [  ]; Pneumococcal[  ];  Other:  Past Medical History  Diagnosis Date  . CHF (congestive heart failure)   . COPD (chronic obstructive pulmonary disease)   . Coronary artery disease   . Diabetes mellitus   . Back pain   . Hyperlipemia   . Anemia   . Hypertension     dr Karlon Schlafer  . Renal insufficiency     not on dialysis  . Anginal pain   . Depression   . Shortness of breath   . Sleep apnea     USES CPAP  . GERD (gastroesophageal reflux disease)     Medications Prior to Admission  Medication Sig Dispense Refill  . ARIPiprazole (ABILIFY) 5 MG tablet Take 5 mg by mouth daily.      Marland Kitchen aspirin 81 MG EC tablet Take 81 mg by mouth daily.       Marland Kitchen atorvastatin (LIPITOR) 40 MG tablet Take 40 mg by mouth daily.      . baclofen (LIORESAL) 20 MG tablet Take 20 mg by mouth at bedtime.       Marland Kitchen buPROPion (WELLBUTRIN SR) 150 MG 12 hr tablet Take 150 mg by mouth daily.       . carvedilol (COREG) 25 MG tablet Take 1 tablet (25 mg total) by mouth 2 (two) times daily with a meal.  180 tablet  3  . citalopram (CELEXA) 20 MG tablet Take 20 mg by mouth daily.        . cloNIDine (CATAPRES) 0.1 MG tablet Take 0.1 mg by mouth 2 (two) times daily.      . dorzolamide-timolol (COSOPT) 22.3-6.8 MG/ML  ophthalmic solution Place 1 drop into both eyes 2 (two) times daily.       . hydrALAZINE (APRESOLINE) 100 MG tablet Take 1 tablet (100 mg total) by mouth 3 (three) times daily.  270 tablet  3  . insulin aspart (NOVOLOG) 100 UNIT/ML injection Inject 2-15 Units into the skin 3 (three) times daily before meals. Sliding scale      . insulin detemir (LEVEMIR) 100 UNIT/ML injection Inject 35 Units into the skin 2 (two) times daily.       . isosorbide mononitrate (IMDUR) 60 MG 24 hr tablet Take 2 tablets (120 mg total) by mouth daily.  180 tablet  3  . metolazone (ZAROXOLYN) 2.5 MG tablet Take 2.5 mg by mouth 2 (two) times daily. Take 1 tab As directed      . NIFEdipine (PROCARDIA XL/ADALAT-CC) 60 MG 24 hr tablet Take 60 mg by mouth 2 (two) times daily.      . pantoprazole (PROTONIX) 40 MG tablet Take 40 mg by mouth 2 (two) times daily.      Marland Kitchen perphenazine (TRILAFON) 2 MG tablet Take 2 mg by mouth at bedtime.      Marland Kitchen testosterone (ANDROGEL) 50 MG/5GM GEL Place 5 g onto the skin daily.      Marland Kitchen  torsemide (DEMADEX) 20 MG tablet Take 40 mg by mouth 2 (two) times daily.      Marland Kitchen DISCONTD: metolazone (ZAROXOLYN) 2.5 MG tablet Take 1 tab As directed  10 tablet  3  . diphenhydrAMINE (BENADRYL) 25 MG tablet Take 25 mg by mouth at bedtime as needed. For sleep         No Known Allergies  History   Social History  . Marital Status: Legally Separated    Spouse Name: N/A    Number of Children: N/A  . Years of Education: N/A   Occupational History  . Not on file.   Social History Main Topics  . Smoking status: Current Everyday Smoker -- 1.0 packs/day for 30 years    Types: Cigarettes  . Smokeless tobacco: Never Used  . Alcohol Use: Yes     states he drinks 1/5 of liquor dailt- last drink 30 days.   no etoh x 67mos       ys ago  . Drug Use: Yes    Special: Cocaine     states last used 30 days ago 2012  . Sexually Active: Not Currently   Other Topics Concern  . Not on file   Social History Narrative    . No narrative on file    Family History  Problem Relation Age of Onset  . Diabetes Mother   . Heart disease Mother   . Hyperlipidemia Mother   . Hypertension Mother   . Diabetes Father   . Heart disease Father   . Hyperlipidemia Father   . Hypertension Father   . Diabetes Sister   . Hyperlipidemia Sister   . Hypertension Sister   . Diabetes Brother   . Heart disease Brother   . Hyperlipidemia Brother   . Hypertension Brother   . Heart attack Brother   . Other Brother     Amputation    PHYSICAL EXAM: Filed Vitals:   08/07/11 1353  BP: 149/58  Pulse: 64  Temp: 97.9 F (36.6 C)  Resp: 19   General:  Chronically ill appearing. Dyspnea with ambulation HEENT: normal Neck: supple. JVD 10-11 Carotids 2+ bilat; no bruits. No lymphadenopathy or thryomegaly appreciated. Cor: PMI nondisplaced. Regular rate & rhythm. No rubs, gallops or murmurs. Lungs: clear Abdomen: soft, nontender, distended. No hepatosplenomegaly. No bruits or masses. Good bowel sounds. Extremities: no cyanosis, clubbing, rash,  R and LLE 1+edema RUE AVG +bruit/thrill Neuro: alert & oriented x 3, cranial nerves grossly intact. moves all 4 extremities w/o difficulty. Affect pleasant.    Results for orders placed during the hospital encounter of 08/07/11 (from the past 24 hour(s))  GLUCOSE, CAPILLARY     Status: Abnormal   Collection Time   08/07/11 11:25 AM      Component Value Range   Glucose-Capillary 219 (*) 70 - 99 mg/dL   Comment 1 Notify RN     Comment 2 Documented in Chart    PRO B NATRIURETIC PEPTIDE     Status: Abnormal   Collection Time   08/07/11 12:10 PM      Component Value Range   Pro B Natriuretic peptide (BNP) 1015.0 (*) 0 - 125 pg/mL  COMPREHENSIVE METABOLIC PANEL     Status: Abnormal   Collection Time   08/07/11 12:10 PM      Component Value Range   Sodium 139  135 - 145 mEq/L   Potassium 4.2  3.5 - 5.1 mEq/L   Chloride 103  96 - 112 mEq/L  CO2 24  19 - 32 mEq/L   Glucose, Bld  202 (*) 70 - 99 mg/dL   BUN 52 (*) 6 - 23 mg/dL   Creatinine, Ser 2.37 (*) 0.50 - 1.35 mg/dL   Calcium 8.8  8.4 - 10.5 mg/dL   Total Protein 6.7  6.0 - 8.3 g/dL   Albumin 2.8 (*) 3.5 - 5.2 g/dL   AST 22  0 - 37 U/L   ALT 32  0 - 53 U/L   Alkaline Phosphatase 137 (*) 39 - 117 U/L   Total Bilirubin 0.2 (*) 0.3 - 1.2 mg/dL   GFR calc non Af Amer 31 (*) >90 mL/min   GFR calc Af Amer 35 (*) >90 mL/min  CBC     Status: Abnormal   Collection Time   08/07/11 12:10 PM      Component Value Range   WBC 9.6  4.0 - 10.5 K/uL   RBC 3.24 (*) 4.22 - 5.81 MIL/uL   Hemoglobin 9.7 (*) 13.0 - 17.0 g/dL   HCT 29.6 (*) 39.0 - 52.0 %   MCV 91.4  78.0 - 100.0 fL   MCH 29.9  26.0 - 34.0 pg   MCHC 32.8  30.0 - 36.0 g/dL   RDW 13.0  11.5 - 15.5 %   Platelets 240  150 - 400 K/uL   No results found.   ASSESSMENT: 1. A/C Diastolic Heart Failure 2. A/C renal failure baseline 2.5 - now back to baseline 3. HTN 4. DM II 5. COPD 6. OSA 7. ETOH abuse, in remission 8. Diabetic Retinopathy - legally blind.  9. A/C respiratory failure 10. Tobacco abuse   PLAN/DISCUSSION:  Mr Balan has mild to moderate volume overload in the setting of significant renal dysfunction. He has not responded well to oral outpatient therapy. We will admit him for a couple of days of IV diuresis as tolerated. Cover DM2 with SSI.   Will need HH (with telemedicine) upon discharge.   Jon Hoglund,MD 3:14 PM

## 2011-08-07 NOTE — Progress Notes (Signed)
Pt arrived per W/C as direct admit. Placed in bed oriented to room placed on telemetry monitor went over fall safety info. Pt alert and oriented.

## 2011-08-08 LAB — BASIC METABOLIC PANEL
BUN: 54 mg/dL — ABNORMAL HIGH (ref 6–23)
Chloride: 103 mEq/L (ref 96–112)
Glucose, Bld: 114 mg/dL — ABNORMAL HIGH (ref 70–99)
Potassium: 4 mEq/L (ref 3.5–5.1)

## 2011-08-08 LAB — GLUCOSE, CAPILLARY: Glucose-Capillary: 107 mg/dL — ABNORMAL HIGH (ref 70–99)

## 2011-08-08 NOTE — Progress Notes (Signed)
SUBJECTIVE:  No complaints of SOB this am but says his chest feels mildly tight  OBJECTIVE:   Vitals:   Filed Vitals:   08/07/11 1048 08/07/11 1353 08/07/11 2107 08/08/11 0551  BP: 149/73 149/58 134/60 132/58  Pulse: 67 64 66 63  Temp: 97.6 F (36.4 C) 97.9 F (36.6 C) 97.5 F (36.4 C) 97.6 F (36.4 C)  TempSrc: Oral Oral Oral Oral  Resp: 19 19 18 18   Height: 5' 6.93" (1.7 m)     Weight: 126.6 kg (279 lb 1.6 oz)   124.603 kg (274 lb 11.2 oz)  SpO2: 93% 90% 95% 89%   I&O's:   Intake/Output Summary (Last 24 hours) at 08/08/11 1142 Last data filed at 08/08/11 0930  Gross per 24 hour  Intake    687 ml  Output   2700 ml  Net  -2013 ml   TELEMETRY: Reviewed telemetry pt in NSR:     PHYSICAL EXAM General: Well developed, well nourished, in no acute distress Lungs:   Clear bilaterally to auscultation and percussion. Heart:   HRRR S1 S2 Pulses are 2+ & equal. Abdomen: Bowel sounds are positive, abdomen soft and non-tender without masses  Extremities:    DP +1, 1+ edema bilaterally in legs Neuro: Alert and oriented X 3. Psych:  Good affect, responds appropriately   LABS: Basic Metabolic Panel:  Basename 08/08/11 0525 08/07/11 1210  NA 141 139  K 4.0 4.2  CL 103 103  CO2 26 24  GLUCOSE 114* 202*  BUN 54* 52*  CREATININE 2.51* 2.37*  CALCIUM 8.9 8.8  MG -- --  PHOS -- --   Liver Function Tests:  Basename 08/07/11 1210  AST 22  ALT 32  ALKPHOS 137*  BILITOT 0.2*  PROT 6.7  ALBUMIN 2.8*   No results found for this basename: LIPASE:2,AMYLASE:2 in the last 72 hours CBC:  Basename 08/07/11 1210  WBC 9.6  NEUTROABS --  HGB 9.7*  HCT 29.6*  MCV 91.4  PLT 240     RADIOLOGY: No results found.    ASSESSMENT:  1. A/C Diastolic Heart Failure - improving - lungs are clear today and breathing better and responding to lasix IV 2. A/C renal failure baseline 2.5  3. HTN - controlled 4. DM II  5. COPD  6. OSA  7. ETOH abuse, in remission  8. Diabetic  Retinopathy - legally blind.  9. A/C respiratory failure  10. Tobacco abuse   PLAN:   1.  Continue IV Lasix  2.  Check BMET in am 3.  Continue ASA/carvedilol/hydralazine/clonidine/Imdur Sueanne Margarita, MD  08/08/2011  11:42 AM

## 2011-08-08 NOTE — Progress Notes (Signed)
Patient placed on autoset CPAP at 2130 with a 2L oxygen bleed in. RT will continue to monitor.

## 2011-08-08 NOTE — Progress Notes (Signed)
CARDIAC REHAB PHASE I   PRE:  Rate/Rhythm: Sinus Brady 56  BP:  Supine: 121/54    SaO2: 98% room air  MODE:  Ambulation: 600 ft   POST:  Rate/Rhythm: 64  BP:  Supine: 134/70   SaO2: 94% Room air  1420-1455  Order received and appreciated. Patient ambulated in the hallway without complaints of shortness of breath. Patient tolerated walk without difficulty. Patient assisted back to bed with call light within reach.  Venetia Maxon, Christa See

## 2011-08-08 NOTE — Progress Notes (Signed)
Nutrition Education Note  RD consulted for nutrition education regarding CHF. Pt with hx of being legally blind. 50 yo son does his grocery shopping. Pt unable to read handouts, but reports that son can read them to him. Pt does cook. Although, pt has been using cans of soup, baked beans, etc.   RD provided "Low Sodium Nutrition Therapy" handout from the [employer] of [employer]. Reviewed patient's dietary recall. Provided examples on ways to decrease sodium intake in diet. Discouraged intake of processed foods and use of salt shaker. Encouraged fresh fruits and vegetables as well as whole grain sources of carbohydrates to maximize fiber intake.   RD discussed why it is important for patient to adhere to diet recommendations, and emphasized the role of fluids, foods to avoid, and importance of weighing self daily.  Expect some compliance. Pt does report that he is willing to get rid of his canned foods that he cannot rinse.   Body mass index is 43.12 kg/(m^2). Pt meets criteria for extreme obesity - class III based on current BMI.  Current diet order is CHO Modified, 2gm Na, 1500 ml fluid restriction, patient is consuming approximately 75-100% of meals at this time. Labs and medications reviewed. No further nutrition interventions warranted at this time. RD contact information provided. If additional nutrition issues arise, please re-consult RD.   Dana, [place], [place] Pager [phone] After Hours Pager

## 2011-08-09 ENCOUNTER — Encounter (HOSPITAL_COMMUNITY): Payer: Self-pay | Admitting: Physician Assistant

## 2011-08-09 DIAGNOSIS — J9621 Acute and chronic respiratory failure with hypoxia: Secondary | ICD-10-CM

## 2011-08-09 DIAGNOSIS — K219 Gastro-esophageal reflux disease without esophagitis: Secondary | ICD-10-CM

## 2011-08-09 DIAGNOSIS — N179 Acute kidney failure, unspecified: Secondary | ICD-10-CM

## 2011-08-09 DIAGNOSIS — I10 Essential (primary) hypertension: Secondary | ICD-10-CM

## 2011-08-09 DIAGNOSIS — J449 Chronic obstructive pulmonary disease, unspecified: Secondary | ICD-10-CM

## 2011-08-09 DIAGNOSIS — Z720 Tobacco use: Secondary | ICD-10-CM

## 2011-08-09 LAB — BASIC METABOLIC PANEL
CO2: 27 mEq/L (ref 19–32)
Glucose, Bld: 110 mg/dL — ABNORMAL HIGH (ref 70–99)
Potassium: 4 mEq/L (ref 3.5–5.1)
Sodium: 139 mEq/L (ref 135–145)

## 2011-08-09 LAB — GLUCOSE, CAPILLARY
Glucose-Capillary: 114 mg/dL — ABNORMAL HIGH (ref 70–99)
Glucose-Capillary: 203 mg/dL — ABNORMAL HIGH (ref 70–99)

## 2011-08-09 MED ORDER — METOLAZONE 2.5 MG PO TABS
2.5000 mg | ORAL_TABLET | Freq: Once | ORAL | Status: AC
  Administered 2011-08-09: 2.5 mg via ORAL
  Filled 2011-08-09 (×2): qty 1

## 2011-08-09 NOTE — Progress Notes (Signed)
SUBJECTIVE:    Jon Russell is a 51 y/o male with multiple medical problems including obesity, HTN, DM2, COPD (with ongoing tobacco use), ETOH abuse, CRI (baseline Cr 1.8-2.0), AVG placed 05/05/11 , OSA on CPAP and legal blindness (due to diabetic retinopathy). Detox program completed in November , 2012. Previous ischemic work up at [facility] was ok. He has been followed by Heart Failure Clinic at [facility].   Recently discharged from [facility] 08/03/11 after being treated for hyperglycemia. While hospitalized he reported a 23 pound weight. Prior to admit his weight was 261 pounds. On 08/04/11 he contacted the HF clinic due to 23 pound weight gain. He started taking Metolazone 2.5 mg bid and Torsemide 40 mg bid but his weight only went down 3 pounds. Admitted for tune-up.  Weight down 5 pounds from admit (stable overnight). Feels great. Lying flat. No dyspnea or orthopnea. No edema.   OBJECTIVE:   Vitals:   Filed Vitals:   08/08/11 0551 08/08/11 1344 08/08/11 2112 08/09/11 0438  BP: 132/58 133/64 121/58 152/73  Pulse: 63 58 63 63  Temp: 97.6 F (36.4 C) 98 F (36.7 C) 98 F (36.7 C) 98.4 F (36.9 C)  TempSrc: Oral Oral Oral Oral  Resp: 18 18 18 18   Height:      Weight: 124.603 kg (274 lb 11.2 oz)   124.286 kg (274 lb)  SpO2: 89% 96% 93% 92%   I&O's:    Intake/Output Summary (Last 24 hours) at 08/09/11 1024 Last data filed at 08/09/11 [id number]  Gross per 24 hour  Intake    900 ml  Output   3550 ml  Net  -2650 ml   TELEMETRY: Reviewed telemetry pt in NSR:     PHYSICAL EXAM General: Well developed, well nourished, in no acute distress CVP hard to see looks about 7 Lungs:   Clear bilaterally to auscultation and percussion. Heart:   HRRR S1 S2 Pulses are 2+ & equal. Abdomen: Bowel sounds are positive, abdomen soft and non-tender without masses  Extremities:    DP +1, No edema bilaterally in legs Neuro: Alert and oriented X 3. Psych:  Good affect, responds  appropriately   LABS: Basic Metabolic Panel:  Basename 08/09/11 0610 08/08/11 0525  NA 139 141  K 4.0 4.0  CL 102 103  CO2 27 26  GLUCOSE 110* 114*  BUN 51* 54*  CREATININE 2.54* 2.51*  CALCIUM 9.1 8.9  MG -- --  PHOS -- --   Liver Function Tests:  Basename 08/07/11 1210  AST 22  ALT 32  ALKPHOS 137*  BILITOT 0.2*  PROT 6.7  ALBUMIN 2.8*   No results found for this basename: LIPASE:2,AMYLASE:2 in the last 72 hours CBC:  Basename 08/07/11 1210  WBC 9.6  NEUTROABS --  HGB 9.7*  HCT 29.6*  MCV 91.4  PLT 240     RADIOLOGY: No results found.    ASSESSMENT:  1. A/C Diastolic Heart Failure - improving - lungs are clear today and breathing better and responding to lasix IV 2. A/C renal failure baseline 2.5  3. HTN - controlled 4. DM II  5. COPD  6. OSA  7. ETOH abuse, in remission  8. Diabetic Retinopathy - legally blind.  9. A/C respiratory failure  10. Tobacco abuse   PLAN:    Weight still up from previous baseline but volume status looks good. Will give am lasix (with one dose of metolazone) and let him go today. Will see him back in  HF clinic on Thursday. Reinforced need for daily weights and reviewed use of sliding scale diuretics. Resume home meds.    Glori Bickers, MD  08/09/2011  10:24 AM

## 2011-08-09 NOTE — Discharge Summary (Signed)
Discharge Summary   Patient ID: Jon Jon,  Jon Jon, DOB/AGE: August 05, 1960 51 y.o.  Admit date: 08/07/2011 Discharge date: 08/09/2011  Primary Physician: Verner Chol, MD Primary Cardiologist: Pierre Bali, MD  Discharge Diagnoses Principal Problem:  *Diastolic CHF, acute on chronic Active Problems:  HTN (hypertension)  Diabetes type 2, controlled  Hypercholesterolemia  Alcohol dependence  OSA (obstructive sleep apnea)  Acute on chronic renal failure  Tobacco abuse  Acute-on-chronic respiratory failure  COPD (chronic obstructive pulmonary disease)  GERD (gastroesophageal reflux disease)   Allergies No Known Allergies  Diagnostic Studies/Procedures  None  History of Present Illness/Hospital Course  Mr. Jon Jon is a 51yo male with the above problem list who was admitted to [facility] on 08/07/11 for acute on chronic diastolic CHF.   He has multiple medical problems including obesity, HTN, type 2 DM, CKD (s/p AVF placement 04/2011), diabetic retinopathy (legally blind) COPD, ongoing tobacco/hx of EtOH abuse and OSA (on CPAP). He had been recently discharged from [facility] on 08/03/11 after being treated for hyperglycemia. During that admission, he gained approximately 23 lbs. He started taking outpatient metolazone and torsemide with only modest weight reduction. He noted poor urine output. He followed up with Dr. Haroldine Laws the date of admission as a work-in visit. He endorsed increased DOE, orthopnea, cough and LE edema. On assessment, he did have mild to moderate volume overload and significant renal dysfunction (Cr 2.37, baseline 1.8-2.0). Given the patient's worsening heart failure symptoms and lack of adequate response to PO diuretics, he was admitted for IV diuretics, specifically Lasix. He diuresed well, symptoms improved and was felt to be stable for discharge today by Dr. Haroldine Laws. Total I/O: -5038 cc. Admission weight 279 lbs, discharge  weight 274 lbs. BMET this AM reveals Cr elevation to 2.54. He will discharged today, and resume all prior outpatient medications. He will attend a previously scheduled appointment in the heart failure clinic on 08/13/2011. This information, including the importance of taking daily weights and adhering to sodium restrictions, was clearly outlined in the discharge AVS. Home health telemedicine is currently being arranged by case management.   Discharge Vitals:  Blood pressure 152/73, pulse 63, temperature 98.4 F (36.9 C), temperature source Oral, resp. rate 18, height 5' 6.93" (1.7 m), weight 124.286 kg (274 lb), SpO2 92.00%.   Weight change: -2.314 kg (-5 lb 1.6 oz)  Labs: Recent Labs  Basename 08/07/11 1210   WBC 9.6   HGB 9.7*   HCT 29.6*   MCV 91.4   PLT 240   Lab 08/09/11 0610 08/08/11 0525 08/07/11 1210  NA 139 141 139  K 4.0 4.0 4.2  CL 102 103 103  CO2 27 26 24   BUN 51* 54* 52*  CREATININE 2.54* 2.51* 2.37*  CALCIUM 9.1 8.9 8.8  PROT -- -- 6.7  BILITOT -- -- 0.2*  ALKPHOS -- -- 137*  ALT -- -- 32  AST -- -- 22  AMYLASE -- -- --  LIPASE -- -- --  GLUCOSE 110* 114* 202*    Disposition:  Discharge Orders    Future Orders Please Complete By Expires   Ambulatory referral to [place]      Comments:   Please evaluate Jon Jon for admission to [facility].  Disciplines requested: Nursing  Services to provide: Other: telemedicine for CHF  Physician to follow patient's care (the person listed here will be responsible for signing ongoing orders): Referring Provider  Requested Start of Care Date: Next Week  Special Instructions:  Set up with telemedicine     Follow-up Information    Follow up with Glori Bickers, MD on 08/13/2011. (Please attend previously scheduled follow-up appointment with Dr. Haroldine Laws on 08/13/11. Marland Kitchen)    Contact information:   [address] [place] [phone]          Discharge  Medications:  Medication List  As of 08/09/2011  1:01 PM   CONTINUE taking these medications         ARIPiprazole 5 MG tablet   Commonly known as: ABILIFY      aspirin 81 MG EC tablet      atorvastatin 40 MG tablet   Commonly known as: LIPITOR      baclofen 20 MG tablet   Commonly known as: LIORESAL      buPROPion 150 MG 12 hr tablet   Commonly known as: WELLBUTRIN SR      carvedilol 25 MG tablet   Commonly known as: COREG   Take 1 tablet (25 mg total) by mouth 2 (two) times daily with a meal.      citalopram 20 MG tablet   Commonly known as: CELEXA      cloNIDine 0.1 MG tablet   Commonly known as: CATAPRES      diphenhydrAMINE 25 MG tablet   Commonly known as: BENADRYL      dorzolamide-timolol 22.3-6.8 MG/ML ophthalmic solution   Commonly known as: COSOPT      hydrALAZINE 100 MG tablet   Commonly known as: APRESOLINE   Take 1 tablet (100 mg total) by mouth 3 (three) times daily.      insulin aspart 100 UNIT/ML injection   Commonly known as: novoLOG      insulin detemir 100 UNIT/ML injection   Commonly known as: LEVEMIR      isosorbide mononitrate 60 MG 24 hr tablet   Commonly known as: IMDUR   Take 2 tablets (120 mg total) by mouth daily.      metolazone 2.5 MG tablet   Commonly known as: ZAROXOLYN      NIFEdipine 60 MG 24 hr tablet   Commonly known as: PROCARDIA XL/ADALAT-CC      pantoprazole 40 MG tablet   Commonly known as: PROTONIX      perphenazine 2 MG tablet   Commonly known as: TRILAFON      testosterone 50 MG/5GM Gel   Commonly known as: ANDROGEL      torsemide 20 MG tablet   Commonly known as: DEMADEX           Outstanding Labs/Studies: None  Duration of Discharge Encounter: Greater than 30 minutes including physician time.  Signed, R. Valeria Batman, PA-C 08/09/2011, 1:01 PM  Patient seen and examined with Valeria Batman, PA. We discussed all aspects of the encounter. I agree with the assessment and plan as stated above. Patient  seen and examined. Ready for d/c. Please see my rounding note for full details.   Jon Hinnenkamp,MD 8:18 AM

## 2011-08-09 NOTE — Progress Notes (Addendum)
   CARE MANAGEMENT NOTE 08/09/2011  Patient:  Jon Russell, Jon Russell   Account Number:  [id number]  Date Initiated:  08/09/2011  Documentation initiated by:  [facility]  Subjective/Objective Assessment:   CHF     Action/Plan:   lives at home alone   Anticipated DC Date:  08/09/2011   Anticipated DC Plan:  [place]  CM consult      [facility] Choice  HOME HEALTH   Choice offered to / List presented to:  C-1 Patient        [place] arranged  HH-1 RN  [place] agency  [place]   Status of service:  Completed, signed off Medicare Important Message given?   (If response is "NO", the following Medicare IM given date fields will be blank) Date Medicare IM given:   Date Additional Medicare IM given:    Discharge Disposition:  [place]  Per UR Regulation:    If discussed at Long Length of Stay Meetings, dates discussed:    Comments:   08/09/2011 1330 Spoke to pt and he had [facility] in the past. Wanted to use a new agency. Requested [facility]. Pt states he is compliant with meds. He does weigh each day and know to call his MD if he gains 3-5 lbs in a day or one week. And tries to adhere to low sodium diet. Spoke to [employer] on call, [employer]. Faxed orders, F2F, facesheet and d/c summary. Verified address and phone numbers with pt. [name] RN CCM Case Mgmt phone [phone]

## 2011-08-10 ENCOUNTER — Telehealth (HOSPITAL_COMMUNITY): Payer: Self-pay | Admitting: Adult Health

## 2011-08-10 NOTE — Telephone Encounter (Signed)
Left message with follow up appointment made for August 1 at 9:00 am .

## 2011-08-13 ENCOUNTER — Encounter (HOSPITAL_COMMUNITY): Payer: Medicare Other

## 2011-08-18 ENCOUNTER — Inpatient Hospital Stay (HOSPITAL_COMMUNITY): Admission: RE | Admit: 2011-08-18 | Payer: Medicare Other | Source: Ambulatory Visit

## 2011-08-27 DIAGNOSIS — I509 Heart failure, unspecified: Secondary | ICD-10-CM

## 2013-06-23 IMAGING — CR DG CHEST 1V PORT
1 series · 1 of 1 positions shown · non-contrast
Comparison: None

CLINICAL DATA: Shortness of breath and cough.  Chest pains.

PORTABLE CHEST - 1 VIEW

[view not recorded]
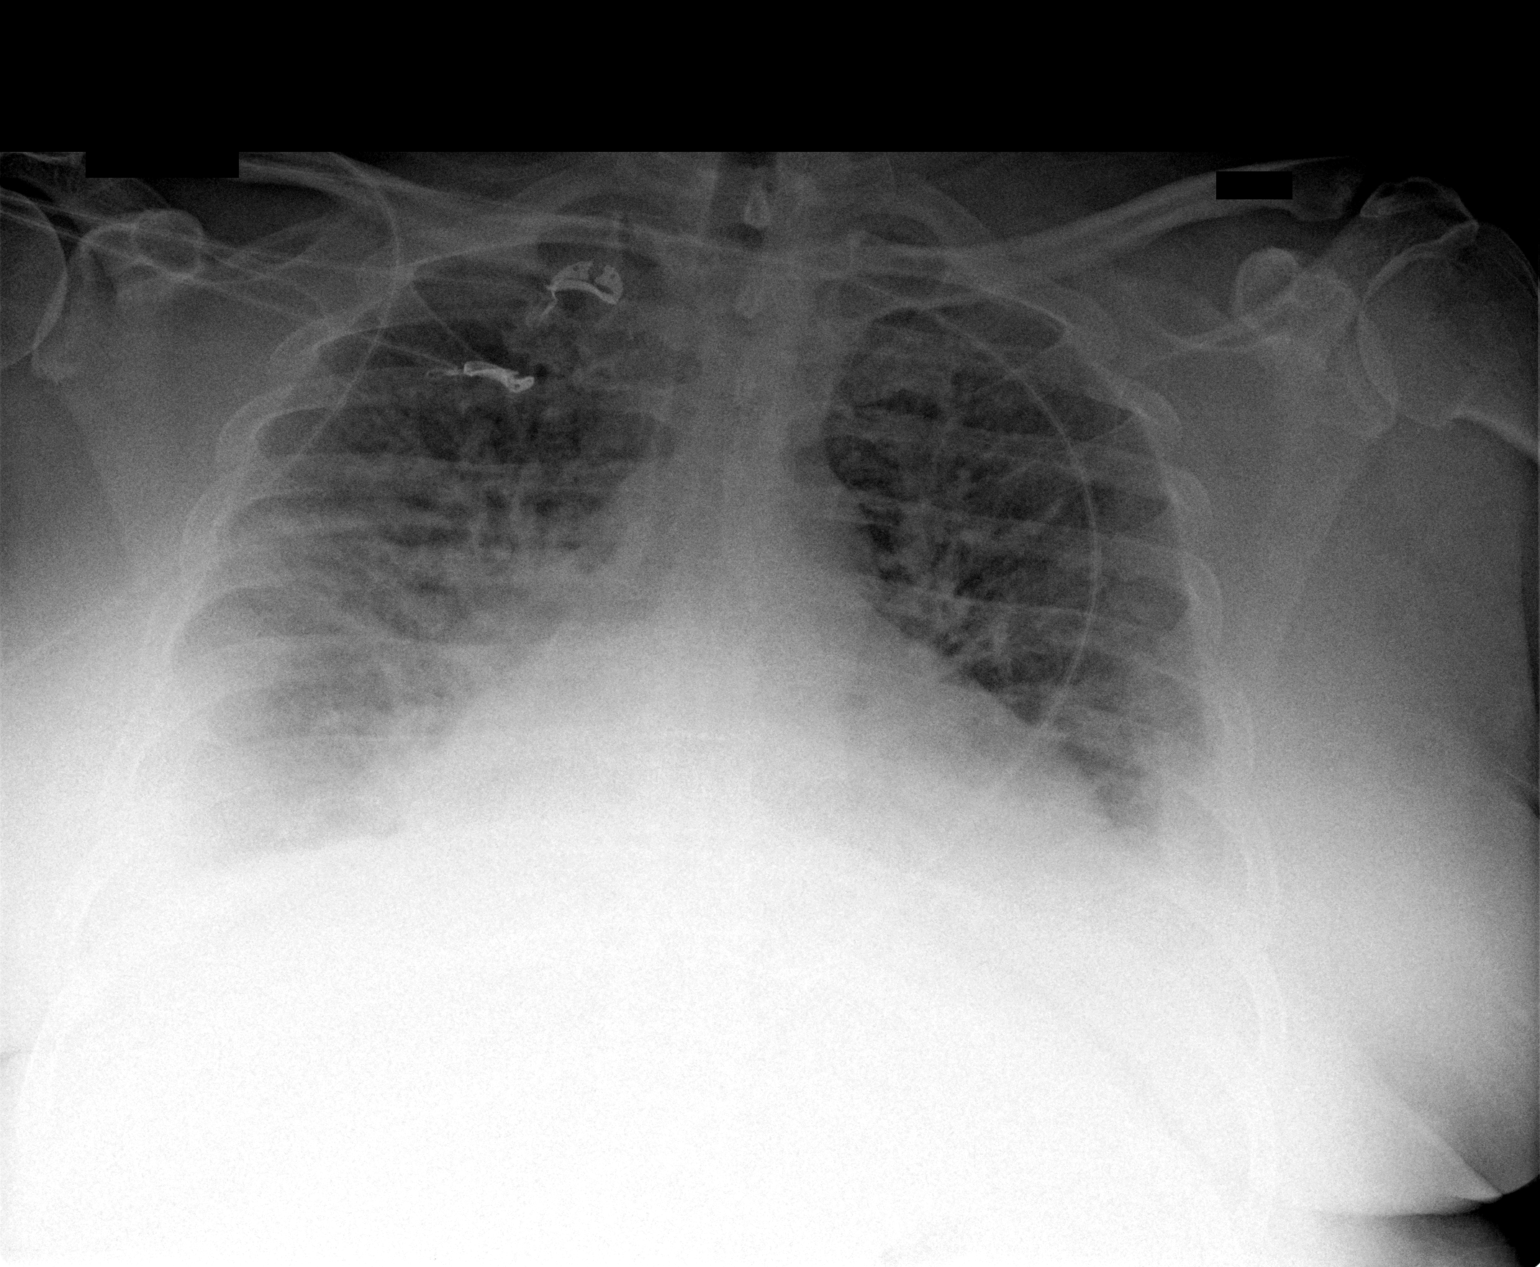

[1 of 1 positions shown; findings below may reference images not displayed]

FINDINGS: Exam detail is significantly diminished secondary to patient's body
habitus.

The heart size is enlarged.

There are small bilateral pleural effusions and diffuse
interstitial edema identified.

No focal airspace consolidation identified.
IMPRESSION: 1.  Suspect CHF.

## 2014-04-06 IMAGING — CR DG CHEST 2V
2 series · 2 of 2 positions shown · non-contrast
Comparison: Chest x-ray 12/13/2010.

CLINICAL DATA: Preoperative study for upcoming left AV fistula
placement.

CHEST - 2 VIEW

[view not recorded (1 of 2)]
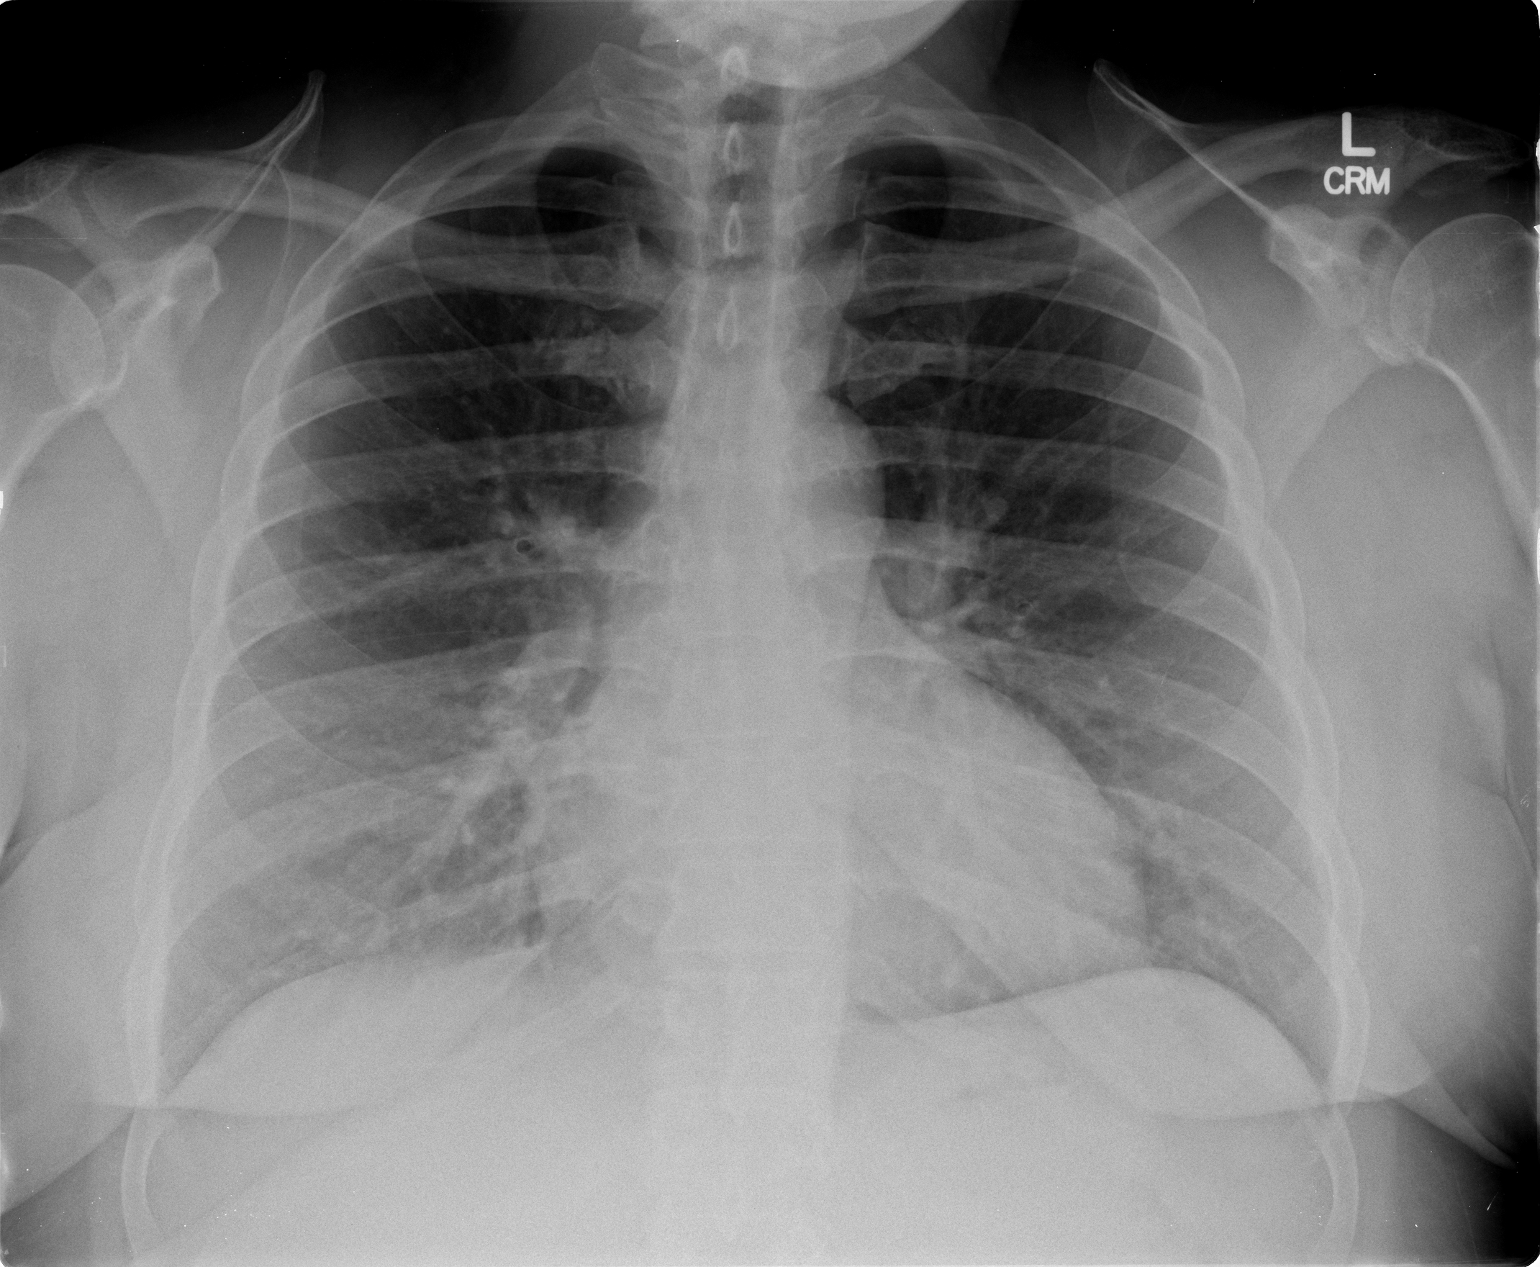

[view not recorded (2 of 2)]
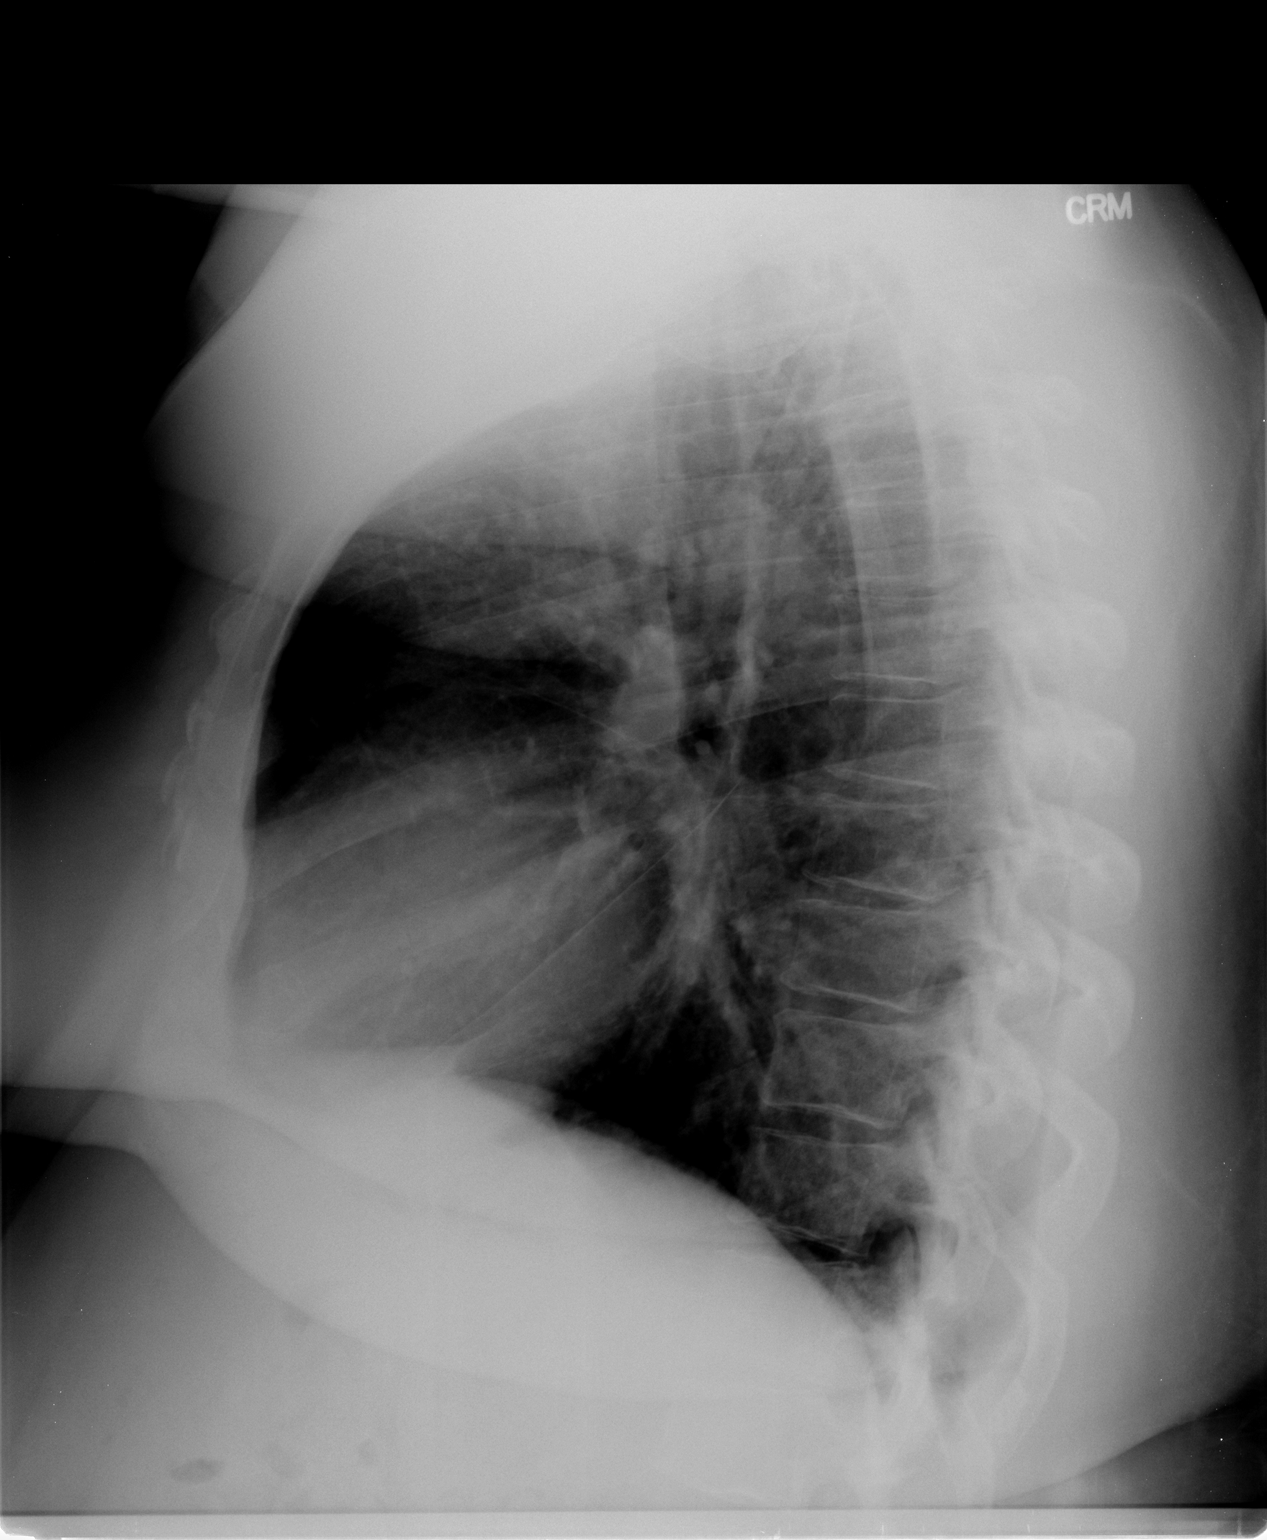

[2 of 2 positions shown; findings below may reference images not displayed]

FINDINGS: Lung volumes are normal.  No consolidative airspace
disease.  No pleural effusions.  No pneumothorax.  No pulmonary
nodule or mass noted.  Pulmonary vasculature and the
cardiomediastinal silhouette are within normal limits.
IMPRESSION: 1. No radiographic evidence of acute cardiopulmonary disease.

## 2015-01-29 ENCOUNTER — Encounter (HOSPITAL_COMMUNITY): Payer: Medicare Other | Admitting: Internal Medicine

## 2016-11-11 ENCOUNTER — Emergency Department (HOSPITAL_COMMUNITY): Payer: Medicare Other

## 2016-11-11 ENCOUNTER — Encounter (HOSPITAL_COMMUNITY): Payer: Self-pay

## 2016-11-11 ENCOUNTER — Inpatient Hospital Stay (HOSPITAL_COMMUNITY)
Admission: EM | Admit: 2016-11-11 | Discharge: 2016-11-13 | DRG: 291 | Disposition: A | Discharge status: Home / Self Care | Payer: Medicare Other | Attending: Internal Medicine | Admitting: Internal Medicine

## 2016-11-11 DIAGNOSIS — I132 Hypertensive heart and chronic kidney disease with heart failure and with stage 5 chronic kidney disease, or end stage renal disease: Principal | ICD-10-CM | POA: Diagnosis present

## 2016-11-11 DIAGNOSIS — J811 Chronic pulmonary edema: Secondary | ICD-10-CM | POA: Diagnosis present

## 2016-11-11 DIAGNOSIS — J449 Chronic obstructive pulmonary disease, unspecified: Secondary | ICD-10-CM | POA: Diagnosis present

## 2016-11-11 DIAGNOSIS — Z79899 Other long term (current) drug therapy: Secondary | ICD-10-CM

## 2016-11-11 DIAGNOSIS — Z992 Dependence on renal dialysis: Secondary | ICD-10-CM | POA: Diagnosis not present

## 2016-11-11 DIAGNOSIS — H548 Legal blindness, as defined in USA: Secondary | ICD-10-CM | POA: Diagnosis present

## 2016-11-11 DIAGNOSIS — I5033 Acute on chronic diastolic (congestive) heart failure: Secondary | ICD-10-CM | POA: Diagnosis present

## 2016-11-11 DIAGNOSIS — N186 End stage renal disease: Secondary | ICD-10-CM

## 2016-11-11 DIAGNOSIS — J9621 Acute and chronic respiratory failure with hypoxia: Secondary | ICD-10-CM | POA: Diagnosis present

## 2016-11-11 DIAGNOSIS — K219 Gastro-esophageal reflux disease without esophagitis: Secondary | ICD-10-CM | POA: Diagnosis not present

## 2016-11-11 DIAGNOSIS — E1121 Type 2 diabetes mellitus with diabetic nephropathy: Secondary | ICD-10-CM | POA: Diagnosis present

## 2016-11-11 DIAGNOSIS — G4733 Obstructive sleep apnea (adult) (pediatric): Secondary | ICD-10-CM | POA: Diagnosis present

## 2016-11-11 DIAGNOSIS — J810 Acute pulmonary edema: Secondary | ICD-10-CM | POA: Diagnosis not present

## 2016-11-11 DIAGNOSIS — F17210 Nicotine dependence, cigarettes, uncomplicated: Secondary | ICD-10-CM | POA: Diagnosis present

## 2016-11-11 DIAGNOSIS — I313 Pericardial effusion (noninflammatory): Secondary | ICD-10-CM | POA: Diagnosis present

## 2016-11-11 DIAGNOSIS — Z833 Family history of diabetes mellitus: Secondary | ICD-10-CM

## 2016-11-11 DIAGNOSIS — D649 Anemia, unspecified: Secondary | ICD-10-CM | POA: Diagnosis present

## 2016-11-11 DIAGNOSIS — Z8249 Family history of ischemic heart disease and other diseases of the circulatory system: Secondary | ICD-10-CM

## 2016-11-11 DIAGNOSIS — R634 Abnormal weight loss: Secondary | ICD-10-CM | POA: Diagnosis present

## 2016-11-11 DIAGNOSIS — Z720 Tobacco use: Secondary | ICD-10-CM | POA: Diagnosis not present

## 2016-11-11 DIAGNOSIS — F329 Major depressive disorder, single episode, unspecified: Secondary | ICD-10-CM | POA: Diagnosis present

## 2016-11-11 DIAGNOSIS — E11319 Type 2 diabetes mellitus with unspecified diabetic retinopathy without macular edema: Secondary | ICD-10-CM | POA: Diagnosis present

## 2016-11-11 DIAGNOSIS — Z6838 Body mass index (BMI) 38.0-38.9, adult: Secondary | ICD-10-CM

## 2016-11-11 DIAGNOSIS — Z9981 Dependence on supplemental oxygen: Secondary | ICD-10-CM | POA: Diagnosis not present

## 2016-11-11 DIAGNOSIS — E1122 Type 2 diabetes mellitus with diabetic chronic kidney disease: Secondary | ICD-10-CM | POA: Diagnosis present

## 2016-11-11 DIAGNOSIS — R0602 Shortness of breath: Secondary | ICD-10-CM | POA: Diagnosis present

## 2016-11-11 DIAGNOSIS — G8929 Other chronic pain: Secondary | ICD-10-CM | POA: Diagnosis present

## 2016-11-11 DIAGNOSIS — F1410 Cocaine abuse, uncomplicated: Secondary | ICD-10-CM | POA: Diagnosis present

## 2016-11-11 DIAGNOSIS — I2510 Atherosclerotic heart disease of native coronary artery without angina pectoris: Secondary | ICD-10-CM | POA: Diagnosis present

## 2016-11-11 DIAGNOSIS — M25531 Pain in right wrist: Secondary | ICD-10-CM | POA: Diagnosis present

## 2016-11-11 DIAGNOSIS — E785 Hyperlipidemia, unspecified: Secondary | ICD-10-CM | POA: Diagnosis present

## 2016-11-11 DIAGNOSIS — I159 Secondary hypertension, unspecified: Secondary | ICD-10-CM | POA: Diagnosis not present

## 2016-11-11 DIAGNOSIS — I10 Essential (primary) hypertension: Secondary | ICD-10-CM | POA: Diagnosis present

## 2016-11-11 DIAGNOSIS — Z794 Long term (current) use of insulin: Secondary | ICD-10-CM

## 2016-11-11 DIAGNOSIS — F1910 Other psychoactive substance abuse, uncomplicated: Secondary | ICD-10-CM | POA: Diagnosis not present

## 2016-11-11 DIAGNOSIS — Z9181 History of falling: Secondary | ICD-10-CM

## 2016-11-11 DIAGNOSIS — Z89022 Acquired absence of left finger(s): Secondary | ICD-10-CM

## 2016-11-11 DIAGNOSIS — M549 Dorsalgia, unspecified: Secondary | ICD-10-CM | POA: Diagnosis present

## 2016-11-11 DIAGNOSIS — Z7982 Long term (current) use of aspirin: Secondary | ICD-10-CM

## 2016-11-11 DIAGNOSIS — F1020 Alcohol dependence, uncomplicated: Secondary | ICD-10-CM | POA: Diagnosis present

## 2016-11-11 DIAGNOSIS — Z8349 Family history of other endocrine, nutritional and metabolic diseases: Secondary | ICD-10-CM

## 2016-11-11 LAB — BASIC METABOLIC PANEL
Anion gap: 5 (ref 5–15)
BUN: 27 mg/dL — ABNORMAL HIGH (ref 6–20)
CO2: 29 mmol/L (ref 22–32)
Calcium: 9.3 mg/dL (ref 8.9–10.3)
Chloride: 103 mmol/L (ref 101–111)
Creatinine, Ser: 5.49 mg/dL — ABNORMAL HIGH (ref 0.61–1.24)
GFR calc Af Amer: 12 mL/min — ABNORMAL LOW (ref >60)
GFR calc non Af Amer: 11 mL/min — ABNORMAL LOW (ref >60)
GLUCOSE: 193 mg/dL — AB (ref 65–99)
POTASSIUM: 5.2 mmol/L — AB (ref 3.5–5.1)
Sodium: 137 mmol/L (ref 135–145)

## 2016-11-11 LAB — POTASSIUM: Potassium: 5 mmol/L (ref 3.5–5.1)

## 2016-11-11 LAB — CBC
HEMATOCRIT: 34.3 % — AB (ref 39.0–52.0)
HEMOGLOBIN: 10.4 g/dL — AB (ref 13.0–17.0)
MCH: 29.6 pg (ref 26.0–34.0)
MCHC: 30.3 g/dL (ref 30.0–36.0)
MCV: 97.7 fL (ref 78.0–100.0)
Platelets: 286 10*3/uL (ref 150–400)
RBC: 3.51 MIL/uL — ABNORMAL LOW (ref 4.22–5.81)
RDW: 14.6 % (ref 11.5–15.5)
WBC: 7.8 10*3/uL (ref 4.0–10.5)

## 2016-11-11 LAB — I-STAT TROPONIN, ED: Troponin i, poc: 0.01 ng/mL (ref 0.00–0.08)

## 2016-11-11 LAB — TROPONIN I: Troponin I: <0.03 ng/mL (ref <0.03)

## 2016-11-11 MED ORDER — HEPARIN SODIUM (PORCINE) 5000 UNIT/ML IJ SOLN
5000.0000 [IU] | Freq: Three times a day (TID) | INTRAMUSCULAR | Status: DC
  Administered 2016-11-12 – 2016-11-13 (×4): 5000 [IU] via SUBCUTANEOUS
  Filled 2016-11-11 (×5): qty 1

## 2016-11-11 MED ORDER — THIAMINE HCL 100 MG/ML IJ SOLN
100.0000 mg | Freq: Every day | INTRAMUSCULAR | Status: DC
Start: 2016-11-11 — End: 2016-11-12

## 2016-11-11 MED ORDER — LORAZEPAM 2 MG/ML IJ SOLN
0.0000 mg | Freq: Four times a day (QID) | INTRAMUSCULAR | Status: DC
  Administered 2016-11-13: 2 mg via INTRAVENOUS
  Filled 2016-11-11: qty 1

## 2016-11-11 MED ORDER — INSULIN ASPART 100 UNIT/ML ~~LOC~~ SOLN
0.0000 [IU] | Freq: Three times a day (TID) | SUBCUTANEOUS | Status: DC
  Administered 2016-11-12: 2 [IU] via SUBCUTANEOUS
  Administered 2016-11-12: 3 [IU] via SUBCUTANEOUS
  Administered 2016-11-13 (×2): 2 [IU] via SUBCUTANEOUS

## 2016-11-11 MED ORDER — ALTEPLASE 2 MG IJ SOLR: 2.0000 mg | Freq: Once | INTRAMUSCULAR | Status: DC | PRN

## 2016-11-11 MED ORDER — ALBUTEROL SULFATE (2.5 MG/3ML) 0.083% IN NEBU: 2.5000 mg | INHALATION_SOLUTION | RESPIRATORY_TRACT | Status: DC | PRN

## 2016-11-11 MED ORDER — ADULT MULTIVITAMIN W/MINERALS CH
1.0000 | ORAL_TABLET | Freq: Every day | ORAL | Status: DC
  Administered 2016-11-12 – 2016-11-13 (×2): 1 via ORAL
  Filled 2016-11-11 (×2): qty 1

## 2016-11-11 MED ORDER — LABETALOL HCL 300 MG PO TABS: 300.0000 mg | ORAL_TABLET | Freq: Two times a day (BID) | ORAL | Status: DC

## 2016-11-11 MED ORDER — SODIUM CHLORIDE 0.9 % IV SOLN: 250.0000 mL | INTRAVENOUS | Status: DC | PRN

## 2016-11-11 MED ORDER — INSULIN DETEMIR 100 UNIT/ML ~~LOC~~ SOLN
15.0000 [IU] | Freq: Two times a day (BID) | SUBCUTANEOUS | Status: DC
  Administered 2016-11-12 – 2016-11-13 (×3): 15 [IU] via SUBCUTANEOUS
  Filled 2016-11-11 (×3): qty 0.15

## 2016-11-11 MED ORDER — NICOTINE 21 MG/24HR TD PT24
21.0000 mg | MEDICATED_PATCH | Freq: Every day | TRANSDERMAL | Status: DC
  Administered 2016-11-12 – 2016-11-13 (×2): 21 mg via TRANSDERMAL
  Filled 2016-11-11 (×2): qty 1

## 2016-11-11 MED ORDER — HEPARIN SODIUM (PORCINE) 1000 UNIT/ML DIALYSIS: 1000.0000 [IU] | INTRAMUSCULAR | Status: DC | PRN

## 2016-11-11 MED ORDER — SODIUM CHLORIDE 0.9 % IV SOLN: 100.0000 mL | INTRAVENOUS | Status: DC | PRN

## 2016-11-11 MED ORDER — LIDOCAINE-PRILOCAINE 2.5-2.5 % EX CREA: 1.0000 "application " | TOPICAL_CREAM | CUTANEOUS | Status: DC | PRN

## 2016-11-11 MED ORDER — ASPIRIN EC 81 MG PO TBEC
81.0000 mg | DELAYED_RELEASE_TABLET | Freq: Every day | ORAL | Status: DC
  Administered 2016-11-12 – 2016-11-13 (×2): 81 mg via ORAL
  Filled 2016-11-11 (×2): qty 1

## 2016-11-11 MED ORDER — PENTAFLUOROPROP-TETRAFLUOROETH EX AERO: 1.0000 "application " | INHALATION_SPRAY | CUTANEOUS | Status: DC | PRN

## 2016-11-11 MED ORDER — PANTOPRAZOLE SODIUM 40 MG PO TBEC
40.0000 mg | DELAYED_RELEASE_TABLET | Freq: Every day | ORAL | Status: DC
  Administered 2016-11-12 – 2016-11-13 (×2): 40 mg via ORAL
  Filled 2016-11-11 (×2): qty 1

## 2016-11-11 MED ORDER — HYDRALAZINE HCL 20 MG/ML IJ SOLN: 5.0000 mg | INTRAMUSCULAR | Status: DC | PRN

## 2016-11-11 MED ORDER — LIDOCAINE HCL (PF) 1 % IJ SOLN: 5.0000 mL | INTRAMUSCULAR | Status: DC | PRN

## 2016-11-11 MED ORDER — LORAZEPAM 2 MG/ML IJ SOLN: 1.0000 mg | Freq: Four times a day (QID) | INTRAMUSCULAR | Status: DC | PRN

## 2016-11-11 MED ORDER — ACETAMINOPHEN 500 MG PO TABS
500.0000 mg | ORAL_TABLET | Freq: Four times a day (QID) | ORAL | Status: DC | PRN
  Administered 2016-11-12 – 2016-11-13 (×2): 500 mg via ORAL
  Filled 2016-11-11 (×2): qty 1

## 2016-11-11 MED ORDER — TORSEMIDE 20 MG PO TABS
20.0000 mg | ORAL_TABLET | Freq: Two times a day (BID) | ORAL | Status: DC
  Administered 2016-11-12 – 2016-11-13 (×3): 20 mg via ORAL
  Filled 2016-11-11 (×3): qty 1

## 2016-11-11 MED ORDER — NITROGLYCERIN 0.4 MG SL SUBL
0.4000 mg | SUBLINGUAL_TABLET | SUBLINGUAL | Status: DC | PRN
  Administered 2016-11-12 (×2): 0.4 mg via SUBLINGUAL
  Filled 2016-11-11 (×2): qty 1

## 2016-11-11 MED ORDER — ASPIRIN 81 MG PO TBEC: 81.0000 mg | DELAYED_RELEASE_TABLET | Freq: Every day | ORAL | Status: DC

## 2016-11-11 MED ORDER — ZOLPIDEM TARTRATE 5 MG PO TABS: 5.0000 mg | ORAL_TABLET | Freq: Every evening | ORAL | Status: DC | PRN

## 2016-11-11 MED ORDER — SODIUM CHLORIDE 0.9% FLUSH: 3.0000 mL | INTRAVENOUS | Status: DC | PRN

## 2016-11-11 MED ORDER — CALCIUM CARBONATE ANTACID 500 MG PO CHEW
3.0000 | CHEWABLE_TABLET | Freq: Three times a day (TID) | ORAL | Status: DC
  Administered 2016-11-12 – 2016-11-13 (×4): 600 mg via ORAL
  Filled 2016-11-11 (×4): qty 3

## 2016-11-11 MED ORDER — FUROSEMIDE 10 MG/ML IJ SOLN
80.0000 mg | Freq: Once | INTRAMUSCULAR | Status: AC
  Administered 2016-11-11: 80 mg via INTRAVENOUS
  Filled 2016-11-11: qty 8

## 2016-11-11 MED ORDER — TRAZODONE HCL 50 MG PO TABS: 50.0000 mg | ORAL_TABLET | Freq: Every evening | ORAL | Status: DC | PRN

## 2016-11-11 MED ORDER — IOPAMIDOL (ISOVUE-370) INJECTION 76%
INTRAVENOUS | Status: AC
  Administered 2016-11-11: 100 mL
  Filled 2016-11-11: qty 100

## 2016-11-11 MED ORDER — DULOXETINE HCL 60 MG PO CPEP
60.0000 mg | ORAL_CAPSULE | Freq: Every day | ORAL | Status: DC
  Administered 2016-11-12 – 2016-11-13 (×2): 60 mg via ORAL
  Filled 2016-11-11 (×3): qty 1

## 2016-11-11 MED ORDER — DORZOLAMIDE HCL-TIMOLOL MAL 2-0.5 % OP SOLN: 1.0000 [drp] | Freq: Two times a day (BID) | OPHTHALMIC | Status: DC | PRN

## 2016-11-11 MED ORDER — NIFEDIPINE ER 60 MG PO TB24
60.0000 mg | ORAL_TABLET | Freq: Two times a day (BID) | ORAL | Status: DC
  Filled 2016-11-11: qty 1

## 2016-11-11 MED ORDER — ISOSORBIDE MONONITRATE ER 60 MG PO TB24
120.0000 mg | ORAL_TABLET | Freq: Every day | ORAL | Status: DC
  Administered 2016-11-12 – 2016-11-13 (×2): 120 mg via ORAL
  Filled 2016-11-11 (×2): qty 2

## 2016-11-11 MED ORDER — DM-GUAIFENESIN ER 30-600 MG PO TB12: 1.0000 | ORAL_TABLET | Freq: Two times a day (BID) | ORAL | Status: DC | PRN

## 2016-11-11 MED ORDER — FOLIC ACID 1 MG PO TABS
1.0000 mg | ORAL_TABLET | Freq: Every day | ORAL | Status: DC
  Administered 2016-11-12 – 2016-11-13 (×2): 1 mg via ORAL
  Filled 2016-11-11 (×3): qty 1

## 2016-11-11 MED ORDER — SODIUM CHLORIDE 0.9% FLUSH
3.0000 mL | Freq: Two times a day (BID) | INTRAVENOUS | Status: DC
  Administered 2016-11-12 – 2016-11-13 (×3): 3 mL via INTRAVENOUS

## 2016-11-11 MED ORDER — LORAZEPAM 1 MG PO TABS: 1.0000 mg | ORAL_TABLET | Freq: Four times a day (QID) | ORAL | Status: DC | PRN

## 2016-11-11 MED ORDER — ONDANSETRON HCL 4 MG/2ML IJ SOLN: 4.0000 mg | Freq: Four times a day (QID) | INTRAMUSCULAR | Status: DC | PRN

## 2016-11-11 MED ORDER — VITAMIN B-1 100 MG PO TABS
100.0000 mg | ORAL_TABLET | Freq: Every day | ORAL | Status: DC
  Administered 2016-11-12 – 2016-11-13 (×2): 100 mg via ORAL
  Filled 2016-11-11 (×3): qty 1

## 2016-11-11 MED ORDER — LORAZEPAM 2 MG/ML IJ SOLN
0.0000 mg | Freq: Two times a day (BID) | INTRAMUSCULAR | Status: DC
Start: 2016-11-13 — End: 2016-11-13

## 2016-11-11 NOTE — Consult Note (Signed)
Referring Provider: No ref. provider found Primary Care Physician:  Verner Chol, MD Primary Nephrologist:   [facility]  Nephrologist  Reason for Consultation: Shortness of breath x 2 weeks  Loosing weight admitted with pulmonary edema  Will need dialysis for oxygen dependent shortness of breath  HPI:  56 y.o. male with medical history significant of ESRD-HD (TTS), hypertension, hyperlipidemia, diabetes mellitus, COPD, chronic respiratory failure on 2 L oxygen at night, GERD, OSA not using CPAP, depression, CAD, dCHF, anemia, chronic back pain, polysubstance abuse (tobacco, alcohol and cocaine),    This is a very pleasant dialysis patient that has a history of diabetes and diabetic nephropathy he has received dialysis for the last 4 years in [facility]  He has had about a 20 lb weight loss over the years although his appetite has been good He states that about 2 weeks ago he noted an increase in his shortness of breath with difficulty walking more that 10 steps at home  He went to [facility] hospital and was admitted last week. 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 Anemia   . Anginal pain ([place])   . Back pain   . Chronic diastolic CHF (congestive heart failure) ([place])   . COPD (chronic obstructive pulmonary disease) ([place])   . Coronary artery disease   . Depression   . Diabetes mellitus   . Diabetic retinopathy ([place])    Legally blind  . GERD (gastroesophageal reflux disease)   . Hyperlipemia   . Hypertension    dr bensimhon  . Renal insufficiency    not on dialysis; 07/2011: baseline Cr 1.8-2.0  . Shortness of breath   . Sleep apnea    USES CPAP    Past Surgical History:  Procedure Laterality Date  . AV FISTULA PLACEMENT   05/05/2011   Procedure: ARTERIOVENOUS (AV) FISTULA CREATION;  Surgeon: Elam Dutch, MD;  Location: [place];  Service: Vascular;  Laterality: Left;  Left Radial Cephalic Arteriovenous Fistula Creation  . EYE SURGERY     legally blind both eyes  . LUNG BIOPSY      Prior to Admission medications   Medication Sig Start Date End Date Taking? Authorizing Provider  acetaminophen (TYLENOL) 500 MG tablet Take 500 mg by mouth every 6 (six) hours as needed for mild pain.   Yes [provider]  albuterol (PROVENTIL HFA;VENTOLIN HFA) 108 (90 Base) MCG/ACT inhaler Inhale 2 puffs into the lungs every 6 (six) hours as needed for wheezing or shortness of breath.   Yes [provider]  aspirin 81 MG EC tablet Take 81 mg by mouth daily.    Yes [provider]  calcium carbonate (TUMS) 500 MG chewable tablet Chew 3 tablets by mouth 3 (three) times daily.   Yes [provider]  dorzolamide-timolol (COSOPT) 22.3-6.8 MG/ML ophthalmic solution Place 1 drop into both eyes 2 (two) times daily as needed (itching).    Yes [provider]  DULoxetine (CYMBALTA) 60 MG capsule Take 60 mg by mouth daily.   Yes [provider]  insulin aspart (NOVOLOG) 100 UNIT/ML injection Inject 2-15 Units into the skin 3 (three) times daily before meals. Sliding scale   Yes  [provider]  insulin detemir (LEVEMIR) 100 UNIT/ML injection Inject 20 Units into the skin 2 (two) times daily.  12/16/10  Yes Laza, Sorin C, MD  isosorbide mononitrate (IMDUR) 60 MG 24 hr tablet Take 2 tablets (120 mg total) by mouth daily. 05/18/11  Yes Bensimhon, Shaune Pascal, MD  labetalol (NORMODYNE) 300 MG tablet Take 300 mg by mouth 2 (two) times daily.   Yes [provider]  NIFEdipine (PROCARDIA XL/ADALAT-CC) 60 MG 24 hr tablet Take 60 mg by mouth 2 (two) times daily. 12/29/10  Yes Bensimhon, Shaune Pascal, MD  pantoprazole (PROTONIX) 40 MG tablet Take 40 mg by mouth daily.    Yes Bensimhon, Shaune Pascal, MD  torsemide (DEMADEX) 20 MG tablet Take 20 mg by mouth 2 (two) times daily.    Yes Wynetta Emery, PA-C  traZODone (DESYREL) 50 MG tablet Take 50-100 mg by mouth at bedtime as needed for sleep.   Yes [provider]  carvedilol (COREG) 25 MG tablet Take 1 tablet (25 mg total) by mouth 2 (two) times daily with a meal. Patient not taking: Reported on 11/11/2016 05/18/11   Bensimhon, Shaune Pascal, MD  hydrALAZINE (APRESOLINE) 100 MG tablet Take 1 tablet (100 mg total) by mouth 3 (three) times daily. Patient not taking: Reported on 11/11/2016 05/18/11   Bensimhon, Shaune Pascal, MD    Current Facility-Administered Medications  Medication Dose Route Frequency Provider Last Rate Last Dose  . 0.9 %  sodium chloride infusion  250 mL Intravenous PRN Ivor Costa, MD      . acetaminophen (TYLENOL) tablet 500 mg  500 mg Oral Q6H PRN Ivor Costa, MD      . albuterol (PROVENTIL) (2.5 MG/3ML) 0.083% nebulizer solution 2.5 mg  2.5 mg Nebulization Q4H PRN Ivor Costa, MD      . aspirin EC tablet 81 mg  81 mg Oral Daily [name], MD      . calcium carbonate (TUMS - dosed in mg elemental calcium) chewable tablet 600 mg of elemental calcium  3 tablet Oral TID Ivor Costa, MD      . dextromethorphan-guaiFENesin ([place] DM) 30-600 MG per 12 hr tablet 1 tablet  1 tablet Oral BID PRN Ivor Costa, MD      . dorzolamide-timolol (COSOPT) 22.3-6.8 MG/ML ophthalmic solution 1 drop  1 drop Both Eyes BID PRN Ivor Costa, MD      . DULoxetine (CYMBALTA) DR capsule 60 mg  60 mg Oral Daily Ivor Costa, MD      . folic acid (FOLVITE) tablet 1 mg  1 mg Oral Daily Ivor Costa, MD      . heparin injection 5,000 Units  5,000 Units Subcutaneous Q8H Ivor Costa, MD      . hydrALAZINE (APRESOLINE) injection 5 mg  5 mg Intravenous Q2H PRN Ivor Costa, MD      . Derrill Memo ON 11/12/2016] insulin aspart (novoLOG) injection 0-9 Units  0-9 Units Subcutaneous TID WC Ivor Costa, MD      . insulin detemir (LEVEMIR) injection 15 Units  15 Units  Subcutaneous BID Ivor Costa, MD      . isosorbide mononitrate (IMDUR) 24 hr tablet 120 mg  120 mg Oral Daily Ivor Costa, MD      . labetalol (NORMODYNE) tablet 300 mg  300 mg Oral BID Ivor Costa, MD      . LORazepam (ATIVAN) injection 0-4 mg  0-4 mg Intravenous Q6H Ivor Costa, MD       Followed by  . [START  ON 11/13/2016] LORazepam (ATIVAN) injection 0-4 mg  0-4 mg Intravenous Q12H Ivor Costa, MD      . LORazepam (ATIVAN) tablet 1 mg  1 mg Oral Q6H PRN Ivor Costa, MD       Or  . LORazepam (ATIVAN) injection 1 mg  1 mg Intravenous Q6H PRN Ivor Costa, MD      . multivitamin with minerals tablet 1 tablet  1 tablet Oral Daily Ivor Costa, MD      . nicotine (NICODERM CQ - dosed in mg/24 hours) patch 21 mg  21 mg Transdermal Daily Ivor Costa, MD      . NIFEdipine (PROCARDIA-XL/ADALAT CC) 24 hr tablet 60 mg  60 mg Oral BID Ivor Costa, MD      . nitroGLYCERIN (NITROSTAT) SL tablet 0.4 mg  0.4 mg Sublingual Q5 min PRN Ivor Costa, MD      . ondansetron [facility]) injection 4 mg  4 mg Intravenous Q6H PRN Ivor Costa, MD      . pantoprazole (PROTONIX) EC tablet 40 mg  40 mg Oral Daily Ivor Costa, MD      . sodium chloride flush (NS) 0.9 % injection 3 mL  3 mL Intravenous Q12H Ivor Costa, MD      . sodium chloride flush (NS) 0.9 % injection 3 mL  3 mL Intravenous PRN Ivor Costa, MD      . thiamine (VITAMIN B-1) tablet 100 mg  100 mg Oral Daily Ivor Costa, MD       Or  . thiamine (B-1) injection 100 mg  100 mg Intravenous Daily Ivor Costa, MD      . torsemide (DEMADEX) tablet 20 mg  20 mg Oral BID Ivor Costa, MD      . traZODone (DESYREL) tablet 50-100 mg  50-100 mg Oral QHS PRN Ivor Costa, MD      . zolpidem (AMBIEN) tablet 5 mg  5 mg Oral QHS PRN,MR X 1 Ivor Costa, MD       Current Outpatient Prescriptions  Medication Sig Dispense Refill  . acetaminophen (TYLENOL) 500 MG tablet Take 500 mg by mouth every 6 (six) hours as needed for mild pain.    Marland Kitchen albuterol (PROVENTIL HFA;VENTOLIN HFA) 108 (90 Base)  MCG/ACT inhaler Inhale 2 puffs into the lungs every 6 (six) hours as needed for wheezing or shortness of breath.    Marland Kitchen aspirin 81 MG EC tablet Take 81 mg by mouth daily.     . calcium carbonate (TUMS) 500 MG chewable tablet Chew 3 tablets by mouth 3 (three) times daily.    . dorzolamide-timolol (COSOPT) 22.3-6.8 MG/ML ophthalmic solution Place 1 drop into both eyes 2 (two) times daily as needed (itching).     . DULoxetine (CYMBALTA) 60 MG capsule Take 60 mg by mouth daily.    . insulin aspart (NOVOLOG) 100 UNIT/ML injection Inject 2-15 Units into the skin 3 (three) times daily before meals. Sliding scale    . insulin detemir (LEVEMIR) 100 UNIT/ML injection Inject 20 Units into the skin 2 (two) times daily.     . isosorbide mononitrate (IMDUR) 60 MG 24 hr tablet Take 2 tablets (120 mg total) by mouth daily. 180 tablet 3  . labetalol (NORMODYNE) 300 MG tablet Take 300 mg by mouth 2 (two) times daily.    Marland Kitchen NIFEdipine (PROCARDIA XL/ADALAT-CC) 60 MG 24 hr tablet Take 60 mg by mouth 2 (two) times daily.    . pantoprazole (PROTONIX) 40 MG tablet Take 40 mg by mouth daily.     Marland Kitchen  torsemide (DEMADEX) 20 MG tablet Take 20 mg by mouth 2 (two) times daily.     . traZODone (DESYREL) 50 MG tablet Take 50-100 mg by mouth at bedtime as needed for sleep.    . carvedilol (COREG) 25 MG tablet Take 1 tablet (25 mg total) by mouth 2 (two) times daily with a meal. (Patient not taking: Reported on 11/11/2016) 180 tablet 3  . hydrALAZINE (APRESOLINE) 100 MG tablet Take 1 tablet (100 mg total) by mouth 3 (three) times daily. (Patient not taking: Reported on 11/11/2016) 270 tablet 3    Allergies as of 11/11/2016 - Review Complete 11/11/2016  Allergen Reaction Noted  . Statins Other (See Comments) 02/26/2015    Family History  Problem Relation Age of Onset  . Diabetes Mother   . Heart disease Mother   . Hyperlipidemia Mother   . Hypertension Mother   . Diabetes Father   . Heart disease Father   . Hyperlipidemia  Father   . Hypertension Father   . Diabetes Sister   . Hyperlipidemia Sister   . Hypertension Sister   . Diabetes Brother   . Heart disease Brother   . Hyperlipidemia Brother   . Hypertension Brother   . Heart attack Brother   . Other Brother        Amputation    Social History   Social History  . Marital status: Legally Separated    Spouse name: N/A  . Number of children: N/A  . Years of education: N/A   Occupational History  . Not on file.   Social History Main Topics  . Smoking status: Current Every Day Smoker    Packs/day: 1.00    Years: 30.00    Types: Cigarettes  . Smokeless tobacco: Never Used  . Alcohol use Yes     Comment: states he drinks 1/5 of liquor dailt- last drink 30 days.   no etoh x 6mos       ys ago  . Drug use: Yes    Types: Cocaine     Comment: states last used 30 days ago 2012  . Sexual activity: Not Currently   Other Topics Concern  . Not on file   Social History Narrative  . No narrative on file    Review of Systems: Gen: Denies any fever, chills,  ++  Weakness,++  Malaise,20 lb  weight loss in 2 years , HEENT: ++ visual complaints, ++history of Retinopathy. Normal external appearance No Epistaxis or Sore throat. No sinusitis.   CV: Denies chest pain, angina, palpitations, syncope, orthopnea, PND, peripheral edema, and claudication. Resp: +++  dyspnea at rest, +++ dyspnea with exercise, +++ cough,  GI: Denies vomiting blood, jaundice, and fecal incontinence.   Denies dysphagia or odynophagia.  GU : End stage renal disease  Very little urine MS: ++ joint pain  History of gout   Pseudogout  Derm: Denies rash, itching, dry skin, hives, moles, warts, or unhealing ulcers.  Psych: Denies depression, anxiety, memory loss, suicidal ideation, hallucinations, paranoia, and confusion. Heme: Denies bruising, bleeding, and enlarged lymph nodes. Neuro: No headache.  No diplopia. No dysarthria.  No dysphasia.  No history of CVA.  No Seizures. No  paresthesias. . Endocrine ++ DM.  No Thyroid disease.  No Adrenal disease.  Physical Exam: Vital signs in last 24 hours: Temp:  [98.2 F (36.8 C)] 98.2 F (36.8 C) (10/31 1159) Pulse Rate:  [64-93] 93 (10/31 2100) Resp:  [20] 20 (10/31 2100) BP: (151-185)/(64-88) 183/68 (10/31 2100)  SpO2:  [94 %-100 %] 97 % (10/31 2100)   General:    Pleasant man some mild respiratory discomfort using oxygen Head:  Normocephalic and atraumatic. Eyes:  Sclera clear, no icterus.   Conjunctiva pink. Ears:  Normal auditory acuity. Nose:  No deformity, discharge,  or lesions. Mouth:  No deformity or lesions, dentition normal. Neck:  Supple; no masses or thyromegaly. JVP not elevated Lungs:   Some diminished breath sounds at bases  Heart:  Regular rate and rhythm; no murmurs, clicks, rubs,  or gallops. Abdomen:  Soft, nontender and nondistended. No masses, hepatosplenomegaly or hernias noted. Normal bowel sounds, without guarding, and without rebound.   Msk:  Symmetrical without gross deformities. Normal posture. Pulses:  No carotid, renal, femoral bruits. DP and PT symmetrical and equal Extremities:  Without clubbing or edema. Neurologic:  Alert and  oriented x4;  grossly normal neurologically. Skin:  Intact without significant lesions or rashes. Cervical Nodes:  No significant cervical adenopathy. Psych:  Alert and cooperative. Normal mood and affect.  Intake/Output from previous day: No intake/output data recorded. Intake/Output this shift: No intake/output data recorded.  Lab Results:  Recent Labs  11/11/16 1202  WBC 7.8  HGB 10.4*  HCT 34.3*  PLT 286   BMET  Recent Labs  11/11/16 1202 11/11/16 1730  NA 137  --   K 5.2* 5.0  CL 103  --   CO2 29  --   GLUCOSE 193*  --   BUN 27*  --   CREATININE 5.49*  --   CALCIUM 9.3  --    LFT No results for input(s): PROT, ALBUMIN, AST, ALT, ALKPHOS, BILITOT, BILIDIR, IBILI in the last 72 hours. PT/INR No results for input(s): LABPROT,  INR in the last 72 hours. Hepatitis Panel No results for input(s): HEPBSAG, HCVAB, HEPAIGM, HEPBIGM in the last 72 hours.  Studies/Results: Dg Chest 2 View  Result Date: 11/11/2016 CLINICAL DATA:  Chest pain, shortness of breath. EXAM: CHEST  2 VIEW COMPARISON:  Radiographs of November 03, 2016. FINDINGS: Stable cardiomediastinal silhouette. No pneumothorax is noted. Mild central pulmonary vascular congestion is noted with probable bibasilar pulmonary edema and mild pleural effusions. Bony thorax is unremarkable. IMPRESSION: Mild central pulmonary vascular congestion with probable bibasilar pulmonary edema and mild pleural effusions. Electronically Signed   By: Marijo Conception, M.D.   On: 11/11/2016 12:39   Dg Wrist Complete Right  Result Date: 11/11/2016 CLINICAL DATA:  Status post fall 1 month ago. Wrist pain and stiffness EXAM: RIGHT WRIST - COMPLETE 3+ VIEW COMPARISON:  None. FINDINGS: There is no evidence of fracture or dislocation. There is no evidence of arthropathy or other focal bone abnormality. Soft tissues are unremarkable. Peripheral vascular atherosclerotic disease. IMPRESSION: No acute osseous injury of the right wrist. Electronically Signed   By: Kathreen Devoid   On: 11/11/2016 16:56   Ct Angio Chest Pe W And/or Wo Contrast  Result Date: 11/11/2016 CLINICAL DATA:  Shortness of breath for 1 week. COPD. Hypertension. Diabetes. EXAM: CT ANGIOGRAPHY CHEST WITH CONTRAST TECHNIQUE: Multidetector CT imaging of the chest was performed using the standard protocol during bolus administration of intravenous contrast. Multiplanar CT image reconstructions and MIPs were obtained to evaluate the vascular anatomy. CONTRAST:  70 cc Isovue 370 COMPARISON:  Multiple exams, including chest CT 10/22/2015 FINDINGS: Cardiovascular: No filling defect is identified in the pulmonary arterial tree to suggest pulmonary embolus. Atherosclerotic calcification of the aortic arch and coronary arteries. Moderate  cardiomegaly. Mild pericardial effusion. Left axillary  stent. Mediastinum/Nodes: Nodule inferior to the right thyroid lobe, possibly extending from the thyroid or possibly representing a lymph node, 1.3 by 2.2 cm, previously 1.2 by 1.8 cm on 10/22/2015. Right hilar adenopathy the including a 1.6 cm right hilar node on image 53/6. Left lower paratracheal adenopathy including a 1.8 cm node on image 52/6 (formerly 1.4 cm) prominent infrahilar nodal tissue. Lungs/Pleura: Moderate left and small right pleural effusions. Patchy ground-glass opacities in both lungs. If new indistinct pulmonary nodule in the right upper lobe 0.7 by 0.6 cm on image 67/5. Stable 0.4 cm right middle lobe nodule on image 76 78/7. Atelectasis in both lower lobes. Bilateral airway thickening. Secondary pulmonary lobular interstitial accentuation most notable at the lung bases. Thickening of the fissures by fluid. Upper Abdomen: Unremarkable Musculoskeletal: Unremarkable Review of the MIP images confirms the above findings. IMPRESSION: 1. Cardiomegaly with pleural effusions and bilateral patchy airspace opacities favoring acute pulmonary edema. 2. No filling defect is identified in the pulmonary arterial tree to suggest pulmonary embolus. 3. Small pericardial effusion. 4. Scattered adenopathy in the chest, including the right hilum and left lower paratracheal region, increased from 10/22/2015, could be due to congestion from CHF, or pathologic adenopathy from otherwise unknown neoplasm. 5.  Aortic Atherosclerosis (ICD10-I70.0).  Coronary atherosclerosis. Electronically Signed   By: Van Clines M.D.   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

## 2016-11-11 NOTE — ED Provider Notes (Signed)
3:19 PM not in room   [name], MD 11/11/16 [phone]

## 2016-11-11 NOTE — H&P (Addendum)
History and Physical    Jon Russell YQM:[id number] DOB: 04/15/1960 DOA: 11/11/2016  Referring MD/NP/PA:   PCP: Verner Chol, MD   Patient coming from:  The patient is coming from home.  At baseline, pt is independent for most of ADL.   Chief Complaint: SOB  HPI: Jon Russell is a 56 y.o. male with medical history significant of ESRD-HD (TTS), hypertension, hyperlipidemia, diabetes mellitus, COPD, chronic respiratory failure on 2 L oxygen at night, GERD, OSA not using CPAP, depression, CAD, dCHF, anemia, chronic back pain, polysubstance abuse (tobacco, alcohol and cocaine), who presents with shortness of breath.  Patient states that he has been having shortness of breath in the past 3 weeks, which has been progressively getting worse. He is using 2 L oxygen at night at home, which needs to be increased to 3-4 L. Patient has cough with yellow colored mucus production, denies chest pain, but stated that he has chest pressure. Has chills, but no fever. Patient states that he has been compliant to his dialysis. Last dialysis was yesterday. Patient states that he had diarrhea yesterday, which has resolved today. Currently no nausea, vomiting, diarrhea or abdominal pain. No symptoms of UTI or unilateral weakness.  ED Course: pt was found to have potassium of 5.2, bicarbonate 29, creatinine 5.49, BUN 27, WBC 7.8, negative troponin. Chest x-ray showed pulmonary edema and pleural effusion. CT angiogram is negative for PE, but showed pulmonary edema and small pericardial effusion. Patient is admitted to telemetry bed as inpatient. Nephrology, Dr. Justin Mend was consulted.  Review of Systems:   General: no fevers, chills, has fatigue HEENT: no blurry vision, hearing changes or sore throat Respiratory: has dyspnea, coughing. No wheezing CV: no chest pain, no palpitations GI: no nausea, vomiting, abdominal pain, diarrhea, constipation GU: no dysuria, burning on urination, increased urinary frequency,  hematuria  Ext: no leg edema Neuro: no unilateral weakness, numbness, or tingling, no vision change or hearing loss Skin: no rash, no skin tear. MSK: No muscle spasm, no deformity, no limitation of range of movement in spin Heme: No easy bruising.  Travel history: No recent long distant travel.  Allergy:  Allergies  Allergen Reactions  . Statins Other (See Comments)    Leg weakness    Past Medical History:  Diagnosis Date  . Anemia   . Anginal pain ([place])   . Back pain   . Chronic diastolic CHF (congestive heart failure) ([place])   . COPD (chronic obstructive pulmonary disease) ([place])   . Coronary artery disease   . Depression   . Diabetes mellitus   . Diabetic retinopathy ([place])    Legally blind  . GERD (gastroesophageal reflux disease)   . Hyperlipemia   . Hypertension    dr bensimhon  . Renal insufficiency    not on dialysis; 07/2011: baseline Cr 1.8-2.0  . Shortness of breath   . Sleep apnea    USES CPAP    Past Surgical History:  Procedure Laterality Date  . AV FISTULA PLACEMENT  05/05/2011   Procedure: ARTERIOVENOUS (AV) FISTULA CREATION;  Surgeon: Elam Dutch, MD;  Location: [facility];  Service: Vascular;  Laterality: Left;  Left Radial Cephalic Arteriovenous Fistula Creation  . EYE SURGERY     legally blind both eyes  . LUNG BIOPSY      Social History:  reports that he has been smoking Cigarettes.  He has a 30.00 pack-year smoking history. He has never used smokeless tobacco. He reports that he drinks alcohol. He reports  that he uses drugs, including Cocaine.  Family History:  Family History  Problem Relation Age of Onset  . Diabetes Mother   . Heart disease Mother   . Hyperlipidemia Mother   . Hypertension Mother   . Diabetes Father   . Heart disease Father   . Hyperlipidemia Father   . Hypertension Father   . Diabetes Sister   . Hyperlipidemia Sister   . Hypertension Sister   . Diabetes Brother   . Heart disease Brother   . Hyperlipidemia Brother     . Hypertension Brother   . Heart attack Brother   . Other Brother        Amputation     Prior to Admission medications   Medication Sig Start Date End Date Taking? Authorizing Provider  acetaminophen (TYLENOL) 500 MG tablet Take 500 mg by mouth every 6 (six) hours as needed for mild pain.   Yes [provider]  albuterol (PROVENTIL HFA;VENTOLIN HFA) 108 (90 Base) MCG/ACT inhaler Inhale 2 puffs into the lungs every 6 (six) hours as needed for wheezing or shortness of breath.   Yes [provider]  aspirin 81 MG EC tablet Take 81 mg by mouth daily.    Yes [provider]  calcium carbonate (TUMS) 500 MG chewable tablet Chew 3 tablets by mouth 3 (three) times daily.   Yes [provider]  dorzolamide-timolol (COSOPT) 22.3-6.8 MG/ML ophthalmic solution Place 1 drop into both eyes 2 (two) times daily as needed (itching).    Yes [provider]  DULoxetine (CYMBALTA) 60 MG capsule Take 60 mg by mouth daily.   Yes [provider]  insulin aspart (NOVOLOG) 100 UNIT/ML injection Inject 2-15 Units into the skin 3 (three) times daily before meals. Sliding scale   Yes [provider]  insulin detemir (LEVEMIR) 100 UNIT/ML injection Inject 20 Units into the skin 2 (two) times daily.  12/16/10  Yes Laza, Sorin C, MD  isosorbide mononitrate (IMDUR) 60 MG 24 hr tablet Take 2 tablets (120 mg total) by mouth daily. 05/18/11  Yes Bensimhon, Shaune Pascal, MD  labetalol (NORMODYNE) 300 MG tablet Take 300 mg by mouth 2 (two) times daily.   Yes [provider]  NIFEdipine (PROCARDIA XL/ADALAT-CC) 60 MG 24 hr tablet Take 60 mg by mouth 2 (two) times daily. 12/29/10  Yes Bensimhon, Shaune Pascal, MD  pantoprazole (PROTONIX) 40 MG tablet Take 40 mg by mouth daily.    Yes Bensimhon, Shaune Pascal, MD  torsemide (DEMADEX) 20 MG tablet Take 20 mg by mouth 2 (two) times daily.    Yes Wynetta Emery, PA-C  traZODone (DESYREL) 50 MG tablet Take 50-100 mg by mouth at  bedtime as needed for sleep.   Yes [provider]  carvedilol (COREG) 25 MG tablet Take 1 tablet (25 mg total) by mouth 2 (two) times daily with a meal. Patient not taking: Reported on 11/11/2016 05/18/11   Bensimhon, Shaune Pascal, MD  hydrALAZINE (APRESOLINE) 100 MG tablet Take 1 tablet (100 mg total) by mouth 3 (three) times daily. Patient not taking: Reported on 11/11/2016 05/18/11   Jolaine Artist, MD    Physical Exam: Vitals:   11/11/16 1930 11/11/16 1945 11/11/16 2000 11/11/16 2030  BP: (!) 174/74  (!) 185/88 (!) 178/67  Pulse: 69 69 69 68  Resp:   20 20  Temp:      TempSrc:      SpO2: 97% 96% 96% 99%   General: Not in acute distress HEENT:  Eyes: PERRL, EOMI, no scleral icterus.       ENT: No discharge from the ears and nose, no pharynx injection, no tonsillar enlargement.        Neck: No JVD, no bruit, no mass felt. Heme: No neck lymph node enlargement. Cardiac: S1/S2, RRR, No murmurs, No gallops or rubs. Respiratory: Has rales bilaterally.  GI: Soft, nondistended, nontender, no rebound pain, no organomegaly, BS present. GU: No hematuria Ext: No pitting leg edema bilaterally. 2+DP/PT pulse bilaterally. Musculoskeletal: No joint deformities, No joint redness or warmth, no limitation of ROM in spin. Skin: No rashes.  Neuro: Alert, oriented X3, cranial nerves II-XII grossly intact, moves all extremities normally. Psych: Patient is not psychotic, no suicidal or hemocidal ideation.  Labs on Admission: I have personally reviewed following labs and imaging studies  CBC:  Recent Labs Lab 11/11/16 1202  WBC 7.8  HGB 10.4*  HCT 34.3*  MCV 97.7  PLT 665   Basic Metabolic Panel:  Recent Labs Lab 11/11/16 1202 11/11/16 1730  NA 137  --   K 5.2* 5.0  CL 103  --   CO2 29  --   GLUCOSE 193*  --   BUN 27*  --   CREATININE 5.49*  --   CALCIUM 9.3  --    GFR: CrCl cannot be calculated (Unknown ideal weight.). Liver Function Tests: No results for  input(s): AST, ALT, ALKPHOS, BILITOT, PROT, ALBUMIN in the last 168 hours. No results for input(s): LIPASE, AMYLASE in the last 168 hours. No results for input(s): AMMONIA in the last 168 hours. Coagulation Profile: No results for input(s): INR, PROTIME in the last 168 hours. Cardiac Enzymes: No results for input(s): CKTOTAL, CKMB, CKMBINDEX, TROPONINI in the last 168 hours. BNP (last 3 results) No results for input(s): PROBNP in the last 8760 hours. HbA1C: No results for input(s): HGBA1C in the last 72 hours. CBG: No results for input(s): GLUCAP in the last 168 hours. Lipid Profile: No results for input(s): CHOL, HDL, LDLCALC, TRIG, CHOLHDL, LDLDIRECT in the last 72 hours. Thyroid Function Tests: No results for input(s): TSH, T4TOTAL, FREET4, T3FREE, THYROIDAB in the last 72 hours. Anemia Panel: No results for input(s): VITAMINB12, FOLATE, FERRITIN, TIBC, IRON, RETICCTPCT in the last 72 hours. Urine analysis: No results found for: COLORURINE, APPEARANCEUR, LABSPEC, PHURINE, GLUCOSEU, HGBUR, BILIRUBINUR, KETONESUR, PROTEINUR, UROBILINOGEN, NITRITE, LEUKOCYTESUR Sepsis Labs: @LABRCNTIP (procalcitonin:4,lacticidven:4) )No results found for this or any previous visit (from the past 240 hour(s)).   Radiological Exams on Admission: Dg Chest 2 View  Result Date: 11/11/2016 CLINICAL DATA:  Chest pain, shortness of breath. EXAM: CHEST  2 VIEW COMPARISON:  Radiographs of November 03, 2016. FINDINGS: Stable cardiomediastinal silhouette. No pneumothorax is noted. Mild central pulmonary vascular congestion is noted with probable bibasilar pulmonary edema and mild pleural effusions. Bony thorax is unremarkable. IMPRESSION: Mild central pulmonary vascular congestion with probable bibasilar pulmonary edema and mild pleural effusions. Electronically Signed   By: Marijo Conception, M.D.   On: 11/11/2016 12:39   Dg Wrist Complete Right  Result Date: 11/11/2016 CLINICAL DATA:  Status post fall 1 month  ago. Wrist pain and stiffness EXAM: RIGHT WRIST - COMPLETE 3+ VIEW COMPARISON:  None. FINDINGS: There is no evidence of fracture or dislocation. There is no evidence of arthropathy or other focal bone abnormality. Soft tissues are unremarkable. Peripheral vascular atherosclerotic disease. IMPRESSION: No acute osseous injury of the right wrist. Electronically Signed   By: Kathreen Devoid   On: 11/11/2016 16:56   Ct  Angio Chest Pe W And/or Wo Contrast  Result Date: 11/11/2016 CLINICAL DATA:  Shortness of breath for 1 week. COPD. Hypertension. Diabetes. EXAM: CT ANGIOGRAPHY CHEST WITH CONTRAST TECHNIQUE: Multidetector CT imaging of the chest was performed using the standard protocol during bolus administration of intravenous contrast. Multiplanar CT image reconstructions and MIPs were obtained to evaluate the vascular anatomy. CONTRAST:  70 cc Isovue 370 COMPARISON:  Multiple exams, including chest CT 10/22/2015 FINDINGS: Cardiovascular: No filling defect is identified in the pulmonary arterial tree to suggest pulmonary embolus. Atherosclerotic calcification of the aortic arch and coronary arteries. Moderate cardiomegaly. Mild pericardial effusion. Left axillary stent. Mediastinum/Nodes: Nodule inferior to the right thyroid lobe, possibly extending from the thyroid or possibly representing a lymph node, 1.3 by 2.2 cm, previously 1.2 by 1.8 cm on 10/22/2015. Right hilar adenopathy the including a 1.6 cm right hilar node on image 53/6. Left lower paratracheal adenopathy including a 1.8 cm node on image 52/6 (formerly 1.4 cm) prominent infrahilar nodal tissue. Lungs/Pleura: Moderate left and small right pleural effusions. Patchy ground-glass opacities in both lungs. If new indistinct pulmonary nodule in the right upper lobe 0.7 by 0.6 cm on image 67/5. Stable 0.4 cm right middle lobe nodule on image 76 78/7. Atelectasis in both lower lobes. Bilateral airway thickening. Secondary pulmonary lobular interstitial  accentuation most notable at the lung bases. Thickening of the fissures by fluid. Upper Abdomen: Unremarkable Musculoskeletal: Unremarkable Review of the MIP images confirms the above findings. IMPRESSION: 1. Cardiomegaly with pleural effusions and bilateral patchy airspace opacities favoring acute pulmonary edema. 2. No filling defect is identified in the pulmonary arterial tree to suggest pulmonary embolus. 3. Small pericardial effusion. 4. Scattered adenopathy in the chest, including the right hilum and left lower paratracheal region, increased from 10/22/2015, could be due to congestion from CHF, or pathologic adenopathy from otherwise unknown neoplasm. 5.  Aortic Atherosclerosis (ICD10-I70.0).  Coronary atherosclerosis. Electronically Signed   By: [place] Clines M.D.   On: 11/11/2016 18:51     EKG: Independently reviewed. Sinus rhythm, QTC 455, LAD, T-wave peaking in V3 to V4   Assessment/Plan Principal Problem:   Acute on chronic respiratory failure with hypoxia (HCC) Active Problems:   HTN (hypertension)   Alcohol dependence (HCC)   Acute on chronic diastolic CHF (congestive heart failure) (HCC)   OSA (obstructive sleep apnea)   ESRD on dialysis (HCC)   Tobacco abuse   COPD (chronic obstructive pulmonary disease) (HCC)   GERD (gastroesophageal reflux disease)   Acute pulmonary edema (HCC)   Pulmonary edema   Polysubstance abuse (HCC)   Acute-on-chronic respiratory failure with hypoxia: Most likely due to pulmonary edema and dCHF exacerbation. His COPD may have partially contributed.  -will admit to tele bed as inpt -renal, dr. Justin Mend was consulted for urgent HD. -nasal canula oxygent  -prn albuterol nebs  Acute on chronic diastolic CHF and pulmonary edema: 2-D echo on 12/14/10 showed EF of 55 dL percent with grade 2 diastolic dysfunction. Now patient has a pulmonary edema and CHF exacerbation. -will repeat 2d echo -trop x 3 -Continue aspirin and albuterol -volume management  per renal by HD  CAD: no CP. -continue ASA, labetalol and Imdur - when necessary nitroglycerin  COPD: -albuterol nebs Prn   HTN (hypertension): Bp is elevated, 185/88 -IV hydralazine when necessary -Continue labetalol, nifedipine, oral hydralazine  Polysubstance abuse: Including tobacco, alcohol and cocaine -Did counseling about importance of quitting substance -Nicotine patch -CIWA protocol -check UDS  GERD: -Protonix  ESRD on dialysis (  TTS): potassium of 5.2, bicarbonate 29, creatinine 5.49, BUN 27 -renal was consulted for HD  Depression: Stable, no suicidal or homicidal ideations. -Continue home medications: Cymbalta  Adenopathy in chest: CTA showed scattered adenopathy in the chest, including the right hilum and left lower paratracheal region, increased from 10/22/2015. Etiology is not clear. -this need to be followed by with PCP   DVT ppx: SQ Heparin   Code Status: Full code Family Communication: None at bed side.     Disposition Plan:  Anticipate discharge back to previous home environment Consults called:  Renal, dr. Justin Mend Admission status:  Inpatient/tele      Date of Service 11/11/2016    Ivor Costa [facility] Pager [phone]  If 7PM-7AM, please contact night-coverage [email or web address] Password [email or web address] 11/11/2016, 9:01 PM

## 2016-11-11 NOTE — ED Notes (Signed)
Pt off the floor at dialysis

## 2016-11-11 NOTE — ED Provider Notes (Signed)
[facility] EMERGENCY DEPARTMENT Provider Note   CSN: [id number] Arrival date & time: 11/11/16  1153     History   Chief Complaint No chief complaint on file.   HPI Jon Russell is a 56 y.o. male.  HPI  Jon Russell is a 56 y.o. male, with a history of CHF, COPD, CAD, DM, HTN, and renal failure on dialysis, presenting to the ED with shortness of breath intermittently for about three weeks. States he has been hospitalized for this issue twice.   Endorses productive cough with yellow and brown sputum for last month. He states, "I've been in the hospital twice and they keep telling me that I just need dialysis, but I haven't been any more than 8lbs over my dry weight." States he does not feel better following dialysis. Does not use oxygen at home, but has had to use home O2 over the last two weeks.   Last dialysis yesterday. Receives dialysis through [facility] facility on [address] in [place]. Nephrologist: Dr. Neta Ehlers Melina Fiddler') PCP: Amalia Hailey No current cardiologist.  Was previously under the care of Dr. Haroldine Laws Last noted chem panel on 10/25 potassium was 4.9.  Denies N/V/D, fever/chills, chest pain, abdominal pain, or any other complaints.   Denies history of PE/DVT, peripheral edema or leg pain, recent surgery/trauma, or prolonged immobilization.   Past Medical History:  Diagnosis Date  . Anemia   . Anginal pain ([place])   . Back pain   . Chronic diastolic CHF (congestive heart failure) ([place])   . COPD (chronic obstructive pulmonary disease) ([place])   . Coronary artery disease   . Depression   . Diabetes mellitus   . Diabetic retinopathy ([place])    Legally blind  . GERD (gastroesophageal reflux disease)   . Hyperlipemia   . Hypertension    dr bensimhon  . Renal insufficiency    not on dialysis; 07/2011: baseline Cr 1.8-2.0  . Shortness of breath   . Sleep apnea    USES CPAP    Patient Active Problem List   Diagnosis Date Noted    . Acute pulmonary edema ([place]) 11/11/2016  . Acute on chronic renal failure ([place]) 08/09/2011  . Tobacco abuse 08/09/2011  . Acute-on-chronic respiratory failure ([place]) 08/09/2011  . COPD (chronic obstructive pulmonary disease) ([place]) 08/09/2011  . GERD (gastroesophageal reflux disease) 08/09/2011  . ESRD on dialysis ([place]) 04/23/2011  . OSA (obstructive sleep apnea) 03/09/2011  . Polypharmacy 12/29/2010  . Chronic diastolic heart failure ([place]) 12/22/2010  . Diastolic CHF, acute on chronic ([place]) 12/16/2010  . Hypoglycemia 12/13/2010  . ARF (acute renal failure) ([place]) 12/13/2010  . HTN (hypertension) 12/12/2010  . Diabetes type 2, controlled ([place]) 12/12/2010  . Anemia associated with chronic renal failure 12/12/2010  . CRF (chronic renal failure) 12/12/2010  . Hypercholesterolemia 12/12/2010  . Alcohol dependence ([place]) 12/12/2010    Past Surgical History:  Procedure Laterality Date  . AV FISTULA PLACEMENT  05/05/2011   Procedure: ARTERIOVENOUS (AV) FISTULA CREATION;  Surgeon: Elam Dutch, MD;  Location: [facility];  Service: Vascular;  Laterality: Left;  Left Radial Cephalic Arteriovenous Fistula Creation  . EYE SURGERY     legally blind both eyes  . LUNG BIOPSY         Home Medications    Prior to Admission medications   Medication Sig Start Date End Date Taking? Authorizing Provider  acetaminophen (TYLENOL) 500 MG tablet Take 500 mg by mouth every 6 (six) hours as  needed for mild pain.   Yes [provider]  albuterol (PROVENTIL HFA;VENTOLIN HFA) 108 (90 Base) MCG/ACT inhaler Inhale 2 puffs into the lungs every 6 (six) hours as needed for wheezing or shortness of breath.   Yes [provider]  aspirin 81 MG EC tablet Take 81 mg by mouth daily.    Yes [provider]  calcium carbonate (TUMS) 500 MG chewable tablet Chew 3 tablets by mouth 3 (three) times daily.   Yes [provider]  dorzolamide-timolol (COSOPT) 22.3-6.8 MG/ML ophthalmic  solution Place 1 drop into both eyes 2 (two) times daily as needed (itching).    Yes [provider]  DULoxetine (CYMBALTA) 60 MG capsule Take 60 mg by mouth daily.   Yes [provider]  insulin aspart (NOVOLOG) 100 UNIT/ML injection Inject 2-15 Units into the skin 3 (three) times daily before meals. Sliding scale   Yes [provider]  insulin detemir (LEVEMIR) 100 UNIT/ML injection Inject 20 Units into the skin 2 (two) times daily.  12/16/10  Yes Laza, Sorin C, MD  isosorbide mononitrate (IMDUR) 60 MG 24 hr tablet Take 2 tablets (120 mg total) by mouth daily. 05/18/11  Yes Bensimhon, Shaune Pascal, MD  labetalol (NORMODYNE) 300 MG tablet Take 300 mg by mouth 2 (two) times daily.   Yes [provider]  NIFEdipine (PROCARDIA XL/ADALAT-CC) 60 MG 24 hr tablet Take 60 mg by mouth 2 (two) times daily. 12/29/10  Yes Bensimhon, Shaune Pascal, MD  pantoprazole (PROTONIX) 40 MG tablet Take 40 mg by mouth daily.    Yes Bensimhon, Shaune Pascal, MD  torsemide (DEMADEX) 20 MG tablet Take 20 mg by mouth 2 (two) times daily.    Yes Wynetta Emery, PA-C  traZODone (DESYREL) 50 MG tablet Take 50-100 mg by mouth at bedtime as needed for sleep.   Yes [provider]  carvedilol (COREG) 25 MG tablet Take 1 tablet (25 mg total) by mouth 2 (two) times daily with a meal. Patient not taking: Reported on 11/11/2016 05/18/11   Bensimhon, Shaune Pascal, MD  hydrALAZINE (APRESOLINE) 100 MG tablet Take 1 tablet (100 mg total) by mouth 3 (three) times daily. Patient not taking: Reported on 11/11/2016 05/18/11   Bensimhon, Shaune Pascal, MD    Family History Family History  Problem Relation Age of Onset  . Diabetes Mother   . Heart disease Mother   . Hyperlipidemia Mother   . Hypertension Mother   . Diabetes Father   . Heart disease Father   . Hyperlipidemia Father   . Hypertension Father   . Diabetes Sister   . Hyperlipidemia Sister   . Hypertension Sister   . Diabetes Brother   . Heart disease  Brother   . Hyperlipidemia Brother   . Hypertension Brother   . Heart attack Brother   . Other Brother        Amputation    Social History Social History  Substance Use Topics  . Smoking status: Current Every Day Smoker    Packs/day: 1.00    Years: 30.00    Types: Cigarettes  . Smokeless tobacco: Never Used  . Alcohol use Yes     Comment: states he drinks 1/5 of liquor dailt- last drink 30 days.   no etoh x 1mos       ys ago     Allergies   Statins   Review of Systems Review of Systems  Constitutional: Positive for fever. Negative for chills and diaphoresis.  Respiratory: Positive  for cough and shortness of breath.   Cardiovascular: Negative for chest pain, palpitations and leg swelling.  Gastrointestinal: Negative for abdominal pain, diarrhea, nausea and vomiting.  Musculoskeletal: Positive for arthralgias.  Neurological: Negative for weakness and numbness.  All other systems reviewed and are negative.    Physical Exam Updated Vital Signs BP (!) 151/67   Pulse 64   Temp 98.2 F (36.8 C) (Oral)   Resp 20   SpO2 100%   Physical Exam  Constitutional: He appears well-developed and well-nourished. No distress.  HENT:  Head: Normocephalic and atraumatic.  Eyes: Conjunctivae are normal.  Neck: Neck supple.  Cardiovascular: Normal rate, regular rhythm, normal heart sounds and intact distal pulses.   Pulmonary/Chest: He has rales in the right lower field and the left lower field.  Abdominal: Soft. There is no tenderness. There is no guarding.  Musculoskeletal: He exhibits tenderness. He exhibits no edema.  Tenderness to the right distal radius without noted swelling, erythema, or deformity. Full passive and active ROM in the right wrist, hand, and elbow.  No tenderness, swelling, increased warmth, or erythema to the lower extremities.   Lymphadenopathy:    He has no cervical adenopathy.  Neurological: He is alert.  Skin: Skin is warm and dry. He is not  diaphoretic.  Psychiatric: He has a normal mood and affect. His behavior is normal.  Nursing note and vitals reviewed.    ED Treatments / Results  Labs (all labs ordered are listed, but only abnormal results are displayed) Labs Reviewed  BASIC METABOLIC PANEL - Abnormal; Notable for the following:       Result Value   Potassium 5.2 (*)    Glucose, Bld 193 (*)    BUN 27 (*)    Creatinine, Ser 5.49 (*)    GFR calc non Af Amer 11 (*)    GFR calc Af Amer 12 (*)    All other components within normal limits  CBC - Abnormal; Notable for the following:    RBC 3.51 (*)    Hemoglobin 10.4 (*)    HCT 34.3 (*)    All other components within normal limits  POTASSIUM  RAPID URINE DRUG SCREEN, HOSP PERFORMED  I-STAT TROPONIN, ED  I-STAT TROPONIN, ED    EKG  EKG Interpretation None       Radiology Dg Chest 2 View  Result Date: 11/11/2016 CLINICAL DATA:  Chest pain, shortness of breath. EXAM: CHEST  2 VIEW COMPARISON:  Radiographs of November 03, 2016. FINDINGS: Stable cardiomediastinal silhouette. No pneumothorax is noted. Mild central pulmonary vascular congestion is noted with probable bibasilar pulmonary edema and mild pleural effusions. Bony thorax is unremarkable. IMPRESSION: Mild central pulmonary vascular congestion with probable bibasilar pulmonary edema and mild pleural effusions. Electronically Signed   By: Marijo Conception, M.D.   On: 11/11/2016 12:39   Dg Wrist Complete Right  Result Date: 11/11/2016 CLINICAL DATA:  Status post fall 1 month ago. Wrist pain and stiffness EXAM: RIGHT WRIST - COMPLETE 3+ VIEW COMPARISON:  None. FINDINGS: There is no evidence of fracture or dislocation. There is no evidence of arthropathy or other focal bone abnormality. Soft tissues are unremarkable. Peripheral vascular atherosclerotic disease. IMPRESSION: No acute osseous injury of the right wrist. Electronically Signed   By: Kathreen Devoid   On: 11/11/2016 16:56   Ct Angio Chest Pe W And/or Wo  Contrast  Result Date: 11/11/2016 CLINICAL DATA:  Shortness of breath for 1 week. COPD. Hypertension. Diabetes. EXAM: CT ANGIOGRAPHY CHEST  WITH CONTRAST TECHNIQUE: Multidetector CT imaging of the chest was performed using the standard protocol during bolus administration of intravenous contrast. Multiplanar CT image reconstructions and MIPs were obtained to evaluate the vascular anatomy. CONTRAST:  70 cc Isovue 370 COMPARISON:  Multiple exams, including chest CT 10/22/2015 FINDINGS: Cardiovascular: No filling defect is identified in the pulmonary arterial tree to suggest pulmonary embolus. Atherosclerotic calcification of the aortic arch and coronary arteries. Moderate cardiomegaly. Mild pericardial effusion. Left axillary stent. Mediastinum/Nodes: Nodule inferior to the right thyroid lobe, possibly extending from the thyroid or possibly representing a lymph node, 1.3 by 2.2 cm, previously 1.2 by 1.8 cm on 10/22/2015. Right hilar adenopathy the including a 1.6 cm right hilar node on image 53/6. Left lower paratracheal adenopathy including a 1.8 cm node on image 52/6 (formerly 1.4 cm) prominent infrahilar nodal tissue. Lungs/Pleura: Moderate left and small right pleural effusions. Patchy ground-glass opacities in both lungs. If new indistinct pulmonary nodule in the right upper lobe 0.7 by 0.6 cm on image 67/5. Stable 0.4 cm right middle lobe nodule on image 76 78/7. Atelectasis in both lower lobes. Bilateral airway thickening. Secondary pulmonary lobular interstitial accentuation most notable at the lung bases. Thickening of the fissures by fluid. Upper Abdomen: Unremarkable Musculoskeletal: Unremarkable Review of the MIP images confirms the above findings. IMPRESSION: 1. Cardiomegaly with pleural effusions and bilateral patchy airspace opacities favoring acute pulmonary edema. 2. No filling defect is identified in the pulmonary arterial tree to suggest pulmonary embolus. 3. Small pericardial effusion. 4.  Scattered adenopathy in the chest, including the right hilum and left lower paratracheal region, increased from 10/22/2015, could be due to congestion from CHF, or pathologic adenopathy from otherwise unknown neoplasm. 5.  Aortic Atherosclerosis (ICD10-I70.0).  Coronary atherosclerosis. Electronically Signed   By: Van Clines M.D.   On: 11/11/2016 18:51    Procedures Procedures (including critical care time)  Medications Ordered in ED Medications  hydrALAZINE (APRESOLINE) injection 5 mg (not administered)  furosemide (LASIX) injection 80 mg (80 mg Intravenous Given 11/11/16 1833)  iopamidol (ISOVUE-370) 76 % injection (100 mLs  Contrast Given 11/11/16 1820)     Initial Impression / Assessment and Plan / ED Course  I have reviewed the triage vital signs and the nursing notes.  Pertinent labs & imaging results that were available during my care of the patient were reviewed by me and considered in my medical decision making (see chart for details).  Clinical Course as of Nov 11 2033  Wed Nov 11, 2016  1659 Spoke with Dr. Neta Ehlers, patient's nephrologist. If discharged, he will see the patient tomorrow at dialysis.  If admitted, states patient will need dialysis here tomorrow and they may have to pull more fluid off than normal.   Can give him at least 80mg  of IV lasix since he still makes urine. Recommends getting UDS due to past cocaine use.   [SJ]  2022 Spoke with Dr. Marijo Sanes, hospitalist. Requests we consult the nephrologists for [facility] to get patient urgent dialysis tonight.  [SJ]  2029 Spoke with Dr. Justin Mend, nephrologist on call for [facility]. States he will have patient undergo dialysis tonight. Requests cycled troponins and subsequent admission.   [SJ]    Clinical Course User Index [SJ] Marshall Kampf C, PA-C    Patient presents with increased shortness of breath.  No previous oxygen requirement.  Now requires 3 L/min supplemental O2 to maintain adequate SPO2 while at rest.  SPO2 still  drops to 88-90% while walking and patient feels "  unsteady."   Infectious source was considered, however, patient is afebrile, not tachycardic, and has no leukocytosis.  No signs of infiltrate on CXR or chest CT. Worsening CHF is a consideration.  Patient underwent heart cath on April 27, 2016 with no acute abnormality noted and LVEF of 60%. Admission for new oxygen requirement.  Patient and patient agrees to the plan.  Findings and plan of care discussed with [name], MD.    Vitals:   11/11/16 1159 11/11/16 1600  BP: (!) 151/67 (!) 155/64  Pulse: 64 68  Resp: 20   Temp: 98.2 F (36.8 C)   TempSrc: Oral   SpO2: 100% 97%   Vitals:   11/11/16 1900 11/11/16 1930 11/11/16 1945 11/11/16 2000  BP: (!) 173/72 (!) 174/74  (!) 185/88  Pulse: 69 69 69 69  Resp: 20   20  Temp:      TempSrc:      SpO2: 94% 97% 96% 96%     Final Clinical Impressions(s) / ED Diagnoses   Final diagnoses:  Shortness of breath  Acute pulmonary edema [facility])    New Prescriptions New Prescriptions   No medications on file     Layla Maw 11/11/16 2035    [name], MD 11/12/16 [phone]

## 2016-11-11 NOTE — Care Management (Signed)
ED CM reviewed patient's record. Patient presented to [facility] ED with c/o SOB with hx of CHF, ESRD on HD (T/Th/S) at the [facility]. Patient is receiving transitional care management program at [facility] in [facility], Case Manager noted in care everywhere is Laverle Hobby [phone]. CM verified this information with patient by phone patient OTF in HD. Unit CM will continue to follow up for transitional care needs.

## 2016-11-11 NOTE — ED Triage Notes (Signed)
Patient sent from doctors office for shortness of breath for several days. Uses O2 at home as needed. Arrived on 3l by EMS. DENIES CP, alert and oriented, last dialysis was yesterday. Wrist pain from recent fall

## 2016-11-12 ENCOUNTER — Inpatient Hospital Stay (HOSPITAL_COMMUNITY): Payer: Medicare Other

## 2016-11-12 ENCOUNTER — Other Ambulatory Visit: Payer: Self-pay

## 2016-11-12 DIAGNOSIS — G8929 Other chronic pain: Secondary | ICD-10-CM | POA: Diagnosis not present

## 2016-11-12 DIAGNOSIS — E785 Hyperlipidemia, unspecified: Secondary | ICD-10-CM | POA: Diagnosis not present

## 2016-11-12 DIAGNOSIS — I5033 Acute on chronic diastolic (congestive) heart failure: Secondary | ICD-10-CM | POA: Diagnosis not present

## 2016-11-12 DIAGNOSIS — M25531 Pain in right wrist: Secondary | ICD-10-CM | POA: Diagnosis not present

## 2016-11-12 DIAGNOSIS — I509 Heart failure, unspecified: Secondary | ICD-10-CM

## 2016-11-12 DIAGNOSIS — Z7982 Long term (current) use of aspirin: Secondary | ICD-10-CM | POA: Diagnosis not present

## 2016-11-12 DIAGNOSIS — J9621 Acute and chronic respiratory failure with hypoxia: Secondary | ICD-10-CM | POA: Diagnosis not present

## 2016-11-12 DIAGNOSIS — H548 Legal blindness, as defined in USA: Secondary | ICD-10-CM | POA: Diagnosis not present

## 2016-11-12 DIAGNOSIS — J449 Chronic obstructive pulmonary disease, unspecified: Secondary | ICD-10-CM | POA: Diagnosis not present

## 2016-11-12 DIAGNOSIS — R0602 Shortness of breath: Secondary | ICD-10-CM | POA: Diagnosis present

## 2016-11-12 DIAGNOSIS — I159 Secondary hypertension, unspecified: Secondary | ICD-10-CM | POA: Diagnosis not present

## 2016-11-12 DIAGNOSIS — E1121 Type 2 diabetes mellitus with diabetic nephropathy: Secondary | ICD-10-CM | POA: Diagnosis not present

## 2016-11-12 DIAGNOSIS — F1020 Alcohol dependence, uncomplicated: Secondary | ICD-10-CM | POA: Diagnosis not present

## 2016-11-12 DIAGNOSIS — I313 Pericardial effusion (noninflammatory): Secondary | ICD-10-CM | POA: Diagnosis not present

## 2016-11-12 DIAGNOSIS — N186 End stage renal disease: Secondary | ICD-10-CM | POA: Diagnosis not present

## 2016-11-12 DIAGNOSIS — M549 Dorsalgia, unspecified: Secondary | ICD-10-CM | POA: Diagnosis not present

## 2016-11-12 DIAGNOSIS — E11319 Type 2 diabetes mellitus with unspecified diabetic retinopathy without macular edema: Secondary | ICD-10-CM | POA: Diagnosis not present

## 2016-11-12 DIAGNOSIS — J810 Acute pulmonary edema: Secondary | ICD-10-CM | POA: Diagnosis not present

## 2016-11-12 DIAGNOSIS — Z9981 Dependence on supplemental oxygen: Secondary | ICD-10-CM | POA: Diagnosis not present

## 2016-11-12 DIAGNOSIS — F17210 Nicotine dependence, cigarettes, uncomplicated: Secondary | ICD-10-CM | POA: Diagnosis not present

## 2016-11-12 DIAGNOSIS — Z992 Dependence on renal dialysis: Secondary | ICD-10-CM | POA: Diagnosis not present

## 2016-11-12 DIAGNOSIS — F1910 Other psychoactive substance abuse, uncomplicated: Secondary | ICD-10-CM | POA: Diagnosis not present

## 2016-11-12 DIAGNOSIS — D649 Anemia, unspecified: Secondary | ICD-10-CM | POA: Diagnosis not present

## 2016-11-12 DIAGNOSIS — F1410 Cocaine abuse, uncomplicated: Secondary | ICD-10-CM | POA: Diagnosis not present

## 2016-11-12 DIAGNOSIS — Z89022 Acquired absence of left finger(s): Secondary | ICD-10-CM | POA: Diagnosis not present

## 2016-11-12 DIAGNOSIS — E1122 Type 2 diabetes mellitus with diabetic chronic kidney disease: Secondary | ICD-10-CM | POA: Diagnosis not present

## 2016-11-12 DIAGNOSIS — I2510 Atherosclerotic heart disease of native coronary artery without angina pectoris: Secondary | ICD-10-CM | POA: Diagnosis not present

## 2016-11-12 DIAGNOSIS — Z9181 History of falling: Secondary | ICD-10-CM | POA: Diagnosis not present

## 2016-11-12 DIAGNOSIS — K219 Gastro-esophageal reflux disease without esophagitis: Secondary | ICD-10-CM | POA: Diagnosis not present

## 2016-11-12 DIAGNOSIS — I132 Hypertensive heart and chronic kidney disease with heart failure and with stage 5 chronic kidney disease, or end stage renal disease: Secondary | ICD-10-CM | POA: Diagnosis not present

## 2016-11-12 DIAGNOSIS — G4733 Obstructive sleep apnea (adult) (pediatric): Secondary | ICD-10-CM | POA: Diagnosis not present

## 2016-11-12 LAB — RAPID URINE DRUG SCREEN, HOSP PERFORMED
Amphetamines: NOT DETECTED
Barbiturates: NOT DETECTED
Benzodiazepines: NOT DETECTED
Cocaine: NOT DETECTED
OPIATES: POSITIVE — AB
TETRAHYDROCANNABINOL: NOT DETECTED

## 2016-11-12 LAB — GLUCOSE, CAPILLARY
GLUCOSE-CAPILLARY: 190 mg/dL — AB (ref 65–99)
GLUCOSE-CAPILLARY: 190 mg/dL — AB (ref 65–99)
GLUCOSE-CAPILLARY: 127 mg/dL — AB (ref 65–99)
Glucose-Capillary: 206 mg/dL — ABNORMAL HIGH (ref 65–99)
Glucose-Capillary: 118 mg/dL — ABNORMAL HIGH (ref 65–99)

## 2016-11-12 LAB — RENAL FUNCTION PANEL
ALBUMIN: 3.2 g/dL — AB (ref 3.5–5.0)
Anion gap: 10 (ref 5–15)
BUN: 21 mg/dL — AB (ref 6–20)
CO2: 25 mmol/L (ref 22–32)
CREATININE: 4.63 mg/dL — AB (ref 0.61–1.24)
Calcium: 8.4 mg/dL — ABNORMAL LOW (ref 8.9–10.3)
Chloride: 100 mmol/L — ABNORMAL LOW (ref 101–111)
GFR calc Af Amer: 15 mL/min — ABNORMAL LOW (ref >60)
GFR, EST NON AFRICAN AMERICAN: 13 mL/min — AB (ref >60)
GLUCOSE: 196 mg/dL — AB (ref 65–99)
PHOSPHORUS: 4.5 mg/dL (ref 2.5–4.6)
Potassium: 4.6 mmol/L (ref 3.5–5.1)
Sodium: 135 mmol/L (ref 135–145)

## 2016-11-12 LAB — ECHOCARDIOGRAM COMPLETE: WEIGHTICAEL: 3922.42 [oz_av]

## 2016-11-12 LAB — BASIC METABOLIC PANEL
Anion gap: 9 (ref 5–15)
BUN: 17 mg/dL (ref 6–20)
CALCIUM: 8.6 mg/dL — AB (ref 8.9–10.3)
CO2: 27 mmol/L (ref 22–32)
CREATININE: 4.23 mg/dL — AB (ref 0.61–1.24)
Chloride: 102 mmol/L (ref 101–111)
GFR calc non Af Amer: 14 mL/min — ABNORMAL LOW (ref >60)
GFR, EST AFRICAN AMERICAN: 17 mL/min — AB (ref >60)
GLUCOSE: 107 mg/dL — AB (ref 65–99)
Potassium: 4.5 mmol/L (ref 3.5–5.1)
Sodium: 138 mmol/L (ref 135–145)

## 2016-11-12 LAB — HIV ANTIBODY (ROUTINE TESTING W REFLEX): HIV Screen 4th Generation wRfx: NONREACTIVE

## 2016-11-12 LAB — MRSA PCR SCREENING: MRSA BY PCR: POSITIVE — AB

## 2016-11-12 LAB — TROPONIN I: Troponin I: <0.03 ng/mL (ref <0.03)

## 2016-11-12 MED ORDER — MORPHINE SULFATE (PF) 2 MG/ML IV SOLN
2.0000 mg | Freq: Once | INTRAVENOUS | Status: AC
  Administered 2016-11-12: 2 mg via INTRAVENOUS
  Filled 2016-11-12: qty 1

## 2016-11-12 MED ORDER — OXYCODONE HCL 5 MG PO TABS
10.0000 mg | ORAL_TABLET | Freq: Once | ORAL | Status: AC
  Administered 2016-11-12: 10 mg via ORAL

## 2016-11-12 MED ORDER — OXYCODONE HCL 5 MG PO TABS
ORAL_TABLET | ORAL | Status: AC
  Administered 2016-11-12: 10 mg via ORAL
  Filled 2016-11-12: qty 2

## 2016-11-12 MED ORDER — SODIUM CHLORIDE 0.9 % IV SOLN: 100.0000 mL | INTRAVENOUS | Status: DC | PRN

## 2016-11-12 MED ORDER — CHLORHEXIDINE GLUCONATE CLOTH 2 % EX PADS
6.0000 | MEDICATED_PAD | Freq: Every day | CUTANEOUS | Status: DC
  Administered 2016-11-12 – 2016-11-13 (×2): 6 via TOPICAL

## 2016-11-12 MED ORDER — LIDOCAINE-PRILOCAINE 2.5-2.5 % EX CREA: 1.0000 "application " | TOPICAL_CREAM | CUTANEOUS | Status: DC | PRN

## 2016-11-12 MED ORDER — PENTAFLUOROPROP-TETRAFLUOROETH EX AERO: 1.0000 "application " | INHALATION_SPRAY | CUTANEOUS | Status: DC | PRN

## 2016-11-12 MED ORDER — TRAMADOL HCL 50 MG PO TABS
50.0000 mg | ORAL_TABLET | Freq: Four times a day (QID) | ORAL | Status: DC | PRN
  Administered 2016-11-12 – 2016-11-13 (×3): 50 mg via ORAL
  Filled 2016-11-12 (×3): qty 1

## 2016-11-12 MED ORDER — HEPARIN SODIUM (PORCINE) 1000 UNIT/ML DIALYSIS: 1000.0000 [IU] | INTRAMUSCULAR | Status: DC | PRN

## 2016-11-12 MED ORDER — LIDOCAINE HCL (PF) 1 % IJ SOLN: 5.0000 mL | INTRAMUSCULAR | Status: DC | PRN

## 2016-11-12 MED ORDER — MUPIROCIN 2 % EX OINT
1.0000 "application " | TOPICAL_OINTMENT | Freq: Two times a day (BID) | CUTANEOUS | Status: DC
  Administered 2016-11-12 – 2016-11-13 (×4): 1 via NASAL
  Filled 2016-11-12 (×3): qty 22

## 2016-11-12 MED ORDER — ALUM & MAG HYDROXIDE-SIMETH 200-200-20 MG/5ML PO SUSP
30.0000 mL | ORAL | Status: DC | PRN
  Administered 2016-11-12: 30 mL via ORAL
  Filled 2016-11-12: qty 30

## 2016-11-12 MED ORDER — ALTEPLASE 2 MG IJ SOLR: 2.0000 mg | Freq: Once | INTRAMUSCULAR | Status: DC | PRN

## 2016-11-12 MED ORDER — NIFEDIPINE ER 60 MG PO TB24
60.0000 mg | ORAL_TABLET | Freq: Two times a day (BID) | ORAL | Status: DC
  Administered 2016-11-12 – 2016-11-13 (×3): 60 mg via ORAL
  Filled 2016-11-12 (×3): qty 1

## 2016-11-12 MED ORDER — LABETALOL HCL 300 MG PO TABS
300.0000 mg | ORAL_TABLET | Freq: Two times a day (BID) | ORAL | Status: DC
  Administered 2016-11-12 – 2016-11-13 (×3): 300 mg via ORAL
  Filled 2016-11-12 (×3): qty 1

## 2016-11-12 NOTE — Progress Notes (Addendum)
New Admission Note: Pt admitted to room 2W36 from HD  Arrival Method: via stretcher Mental Orientation: alert and oriented x 4 Telemetry:  Initiated per order Assessment: Completed Skin: Intact IV:  R AC Pain: Reports pain in R Wrist due to gout Tubes: None Safety Measures: Safety Fall Prevention Plan has been discussed  Admission: To be completed 6 [place] Orientation: Patient has been orientated to the room, unit and staff.  Family: None at bedside  Orders to be reviewed and implemented. Will continue to monitor the patient. Call light has been placed within reach and bed alarm has been activated.   [name], BSN, RN-BC Phone: [phone]

## 2016-11-12 NOTE — Progress Notes (Signed)
Patient arrived to unit per bed.  Reviewed treatment plan and this RN agrees.  Report received from bedside RN, Princess.  Consent verified.  Patient A & O X 4. Lung sounds diminished to ausculation in all fields. BLE 1+ pitting edema. Cardiac: NSR.  Prepped LUAVF with alcohol and cannulated with two 15 gauge needles.  Pulsation of blood noted.  Flushed access well with saline per protocol.  Connected and secured lines and initiated tx at 1746.  UF goal of 4500 mL and net fluid removal of 4000 mL.  Will continue to monitor.

## 2016-11-12 NOTE — Progress Notes (Signed)
PROGRESS NOTE    Jon Russell  FMB:[id number] DOB: Jul 23, 1960 DOA: 11/11/2016 PCP: Verner Chol, MD   No chief complaint on file.   Brief Narrative:  HPI on 11/11/2016 by Dr. Ivor Costa Jon Russell is a 56 y.o. male with medical history significant of ESRD-HD (TTS), hypertension, hyperlipidemia, diabetes mellitus, COPD, chronic respiratory failure on 2 L oxygen at night, GERD, OSA not using CPAP, depression, CAD, dCHF, anemia, chronic back pain, polysubstance abuse (tobacco, alcohol and cocaine), who presents with shortness of breath.  Patient states that he has been having shortness of breath in the past 3 weeks, which has been progressively getting worse. He is using 2 L oxygen at night at home, which needs to be increased to 3-4 L. Patient has cough with yellow colored mucus production, denies chest pain, but stated that he has chest pressure. Has chills, but no fever. Patient states that he has been compliant to his dialysis. Last dialysis was yesterday. Patient states that he had diarrhea yesterday, which has resolved today. Currently no nausea, vomiting, diarrhea or abdominal pain. No symptoms of UTI or unilateral weakness. Assessment & Plan   Acute on chronic hypoxic respiratory failure  -CXR showed pulm vascular congestion, mild pleural effusions -CTA chest: showed cardiomegaly with pleural effusion, pulm edema. Negative for PE -continue supplemental oxygen to maintain O2 saturations above 92% -patient uses oxygen 2L at home nightly  Chest pain/ History  -patient complaining of chest pain, EKG obtained. Discussed EKG with Dr. Radford Pax, cardiology, no change. -patient given nitro and maalox with some relief of pain -continue to monitor closely -troponin currently being cycled, negative x2  Acute on chronic diastolic heart failure and pulmonary edema -continue volume control with HD -last echocardiogram in 2012 showed EF 70-17%, grade 2 diastolic dysfunction -repeat  echocardiogram pending   ESRD -dialyzes on TTS in [facility] -Nephrology consulted and appreciated -patient dialyzed on 10/31 -discussed with nephrology, will likely dialyze today  COPD -no wheezing noted -continue albuterol nebs PRN  Essential Hypertension -Continue labetalol, nifedipine, torsemide  Polysubstance abuse -UDS pending   Depression -stable, continue cymbalta  Adenopathy-chest -CTA chest scattered adenopathy in the chest, including right hilum, left lower paratracheal region, increased from 10/22/2015. Etiology not clear.  -Will need to follow up with PCP  GERD -continue PPI  DVT Prophylaxis  heparin  Code Status: full  Family Communication: None at bedside  Disposition Plan: Admitted. Pending further recommendations from nephrology  Consultants Nephrology  Cardiology, Dr. Radford Pax  Procedures  None   Antibiotics   Anti-infectives    None      Subjective:   Jon Russell seen and examined today.  States breathing has improved. Denied chest pain this morning. Denies abdominal pain, nausea, vomiting, diarrhea, constipation.   Objective:   Vitals:   11/12/16 0130 11/12/16 0150 11/12/16 0300 11/12/16 0900  BP: (!) 156/68 (!) 164/76 (!) 159/98 138/67  Pulse: 67 73 75 70  Resp:  18 17 20   Temp:  98.1 F (36.7 C) 98.6 F (37 C) 98.5 F (36.9 C)  TempSrc:  Oral Oral Oral  SpO2:   97% 98%  Weight:        Intake/Output Summary (Last 24 hours) at 11/12/16 1327 Last data filed at 11/12/16 0600  Gross per 24 hour  Intake                0 ml  Output             4000 ml  Net            -4000 ml   Filed Weights   11/11/16 2200  Weight: 111.2 kg (245 lb 2.4 oz)    Exam  General: Well developed, well nourished, NAD, appears stated age  HEENT: NCAT, mucous membranes moist.   Cardiovascular: S1 S2 auscultated, no rubs, murmurs or gallops. Regular rate and rhythm.  Respiratory: Clear to auscultation bilaterally with equal chest  rise  Abdomen: Soft, obese, nontender, nondistended, + bowel sounds  Extremities: warm dry without cyanosis clubbing or edema  Neuro: AAOx3, nonfocal  Psych: Normal affect and demeanor with intact judgement and insight   Data Reviewed: I have personally reviewed following labs and imaging studies  CBC:  Recent Labs Lab 11/11/16 1202  WBC 7.8  HGB 10.4*  HCT 34.3*  MCV 97.7  PLT 622   Basic Metabolic Panel:  Recent Labs Lab 11/11/16 1202 11/11/16 1730 11/12/16 1124  NA 137  --  138  K 5.2* 5.0 4.5  CL 103  --  102  CO2 29  --  27  GLUCOSE 193*  --  107*  BUN 27*  --  17  CREATININE 5.49*  --  4.23*  CALCIUM 9.3  --  8.6*   GFR: CrCl cannot be calculated (Unknown ideal weight.). Liver Function Tests: No results for input(s): AST, ALT, ALKPHOS, BILITOT, PROT, ALBUMIN in the last 168 hours. No results for input(s): LIPASE, AMYLASE in the last 168 hours. No results for input(s): AMMONIA in the last 168 hours. Coagulation Profile: No results for input(s): INR, PROTIME in the last 168 hours. Cardiac Enzymes:  Recent Labs Lab 11/11/16 2143 11/12/16 1124  TROPONINI <0.03 <0.03   BNP (last 3 results) No results for input(s): PROBNP in the last 8760 hours. HbA1C: No results for input(s): HGBA1C in the last 72 hours. CBG:  Recent Labs Lab 11/12/16 0305 11/12/16 0757 11/12/16 1155  GLUCAP 190* 206* 118*   Lipid Profile: No results for input(s): CHOL, HDL, LDLCALC, TRIG, CHOLHDL, LDLDIRECT in the last 72 hours. Thyroid Function Tests: No results for input(s): TSH, T4TOTAL, FREET4, T3FREE, THYROIDAB in the last 72 hours. Anemia Panel: No results for input(s): VITAMINB12, FOLATE, FERRITIN, TIBC, IRON, RETICCTPCT in the last 72 hours. Urine analysis: No results found for: COLORURINE, APPEARANCEUR, LABSPEC, [place], GLUCOSEU, HGBUR, BILIRUBINUR, KETONESUR, PROTEINUR, UROBILINOGEN, NITRITE, LEUKOCYTESUR Sepsis  Labs: @LABRCNTIP (procalcitonin:4,lacticidven:4)  ) Recent Results (from the past 240 hour(s))  MRSA PCR Screening     Status: Abnormal   Collection Time: 11/12/16  5:15 AM  Result Value Ref Range Status   MRSA by PCR POSITIVE (A) NEGATIVE Final    Comment:        The GeneXpert MRSA Assay (FDA approved for NASAL specimens only), is one component of a comprehensive MRSA colonization surveillance program. It is not intended to diagnose MRSA infection nor to guide or monitor treatment for MRSA infections. RESULT CALLED TO, READ BACK BY AND VERIFIED WITH: P. CONTY [id number] 11.01.2018 N. MORRIS       Radiology Studies: Dg Chest 2 View  Result Date: 11/11/2016 CLINICAL DATA:  Chest pain, shortness of breath. EXAM: CHEST  2 VIEW COMPARISON:  Radiographs of November 03, 2016. FINDINGS: Stable cardiomediastinal silhouette. No pneumothorax is noted. Mild central pulmonary vascular congestion is noted with probable bibasilar pulmonary edema and mild pleural effusions. Bony thorax is unremarkable. IMPRESSION: Mild central pulmonary vascular congestion with probable bibasilar pulmonary edema and mild pleural effusions. Electronically Signed   By: Sabino Dick  Brooke Bonito, M.D.   On: 11/11/2016 12:39   Dg Wrist Complete Right  Result Date: 11/11/2016 CLINICAL DATA:  Status post fall 1 month ago. Wrist pain and stiffness EXAM: RIGHT WRIST - COMPLETE 3+ VIEW COMPARISON:  None. FINDINGS: There is no evidence of fracture or dislocation. There is no evidence of arthropathy or other focal bone abnormality. Soft tissues are unremarkable. Peripheral vascular atherosclerotic disease. IMPRESSION: No acute osseous injury of the right wrist. Electronically Signed   By: Kathreen Devoid   On: 11/11/2016 16:56   Ct Angio Chest Pe W And/or Wo Contrast  Result Date: 11/11/2016 CLINICAL DATA:  Shortness of breath for 1 week. COPD. Hypertension. Diabetes. EXAM: CT ANGIOGRAPHY CHEST WITH CONTRAST TECHNIQUE: Multidetector CT  imaging of the chest was performed using the standard protocol during bolus administration of intravenous contrast. Multiplanar CT image reconstructions and MIPs were obtained to evaluate the vascular anatomy. CONTRAST:  70 cc Isovue 370 COMPARISON:  Multiple exams, including chest CT 10/22/2015 FINDINGS: Cardiovascular: No filling defect is identified in the pulmonary arterial tree to suggest pulmonary embolus. Atherosclerotic calcification of the aortic arch and coronary arteries. Moderate cardiomegaly. Mild pericardial effusion. Left axillary stent. Mediastinum/Nodes: Nodule inferior to the right thyroid lobe, possibly extending from the thyroid or possibly representing a lymph node, 1.3 by 2.2 cm, previously 1.2 by 1.8 cm on 10/22/2015. Right hilar adenopathy the including a 1.6 cm right hilar node on image 53/6. Left lower paratracheal adenopathy including a 1.8 cm node on image 52/6 (formerly 1.4 cm) prominent infrahilar nodal tissue. Lungs/Pleura: Moderate left and small right pleural effusions. Patchy ground-glass opacities in both lungs. If new indistinct pulmonary nodule in the right upper lobe 0.7 by 0.6 cm on image 67/5. Stable 0.4 cm right middle lobe nodule on image 76 78/7. Atelectasis in both lower lobes. Bilateral airway thickening. Secondary pulmonary lobular interstitial accentuation most notable at the lung bases. Thickening of the fissures by fluid. Upper Abdomen: Unremarkable Musculoskeletal: Unremarkable Review of the MIP images confirms the above findings. IMPRESSION: 1. Cardiomegaly with pleural effusions and bilateral patchy airspace opacities favoring acute pulmonary edema. 2. No filling defect is identified in the pulmonary arterial tree to suggest pulmonary embolus. 3. Small pericardial effusion. 4. Scattered adenopathy in the chest, including the right hilum and left lower paratracheal region, increased from 10/22/2015, could be due to congestion from CHF, or pathologic adenopathy from  otherwise unknown neoplasm. 5.  Aortic Atherosclerosis (ICD10-I70.0).  Coronary atherosclerosis. Electronically Signed   By: Van Clines M.D.   On: 11/11/2016 18:51     Scheduled Meds: . aspirin EC  81 mg Oral Daily  . calcium carbonate  3 tablet Oral TID  . Chlorhexidine Gluconate Cloth  6 each Topical Q0600  . DULoxetine  60 mg Oral Daily  . folic acid  1 mg Oral Daily  . heparin  5,000 Units Subcutaneous Q8H  . insulin aspart  0-9 Units Subcutaneous TID WC  . insulin detemir  15 Units Subcutaneous BID  . isosorbide mononitrate  120 mg Oral Daily  . labetalol  300 mg Oral BID  . LORazepam  0-4 mg Intravenous Q6H   Followed by  . [START ON 11/13/2016] LORazepam  0-4 mg Intravenous Q12H  . multivitamin with minerals  1 tablet Oral Daily  . mupirocin ointment  1 application Nasal BID  . nicotine  21 mg Transdermal Daily  . NIFEdipine  60 mg Oral BID  . pantoprazole  40 mg Oral Daily  . sodium chloride flush  3 mL Intravenous Q12H  . thiamine  100 mg Oral Daily  . torsemide  20 mg Oral BID   Continuous Infusions: . sodium chloride    . sodium chloride    . sodium chloride       LOS: 1 day   Time Spent in minutes   45 minutes  Kellan Raffield D.O. on 11/12/2016 at 1:27 PM  Between 7am to 7pm - Pager - [phone]  After 7pm go to [email or web address] - password [email or web address]  And look for the night coverage person covering for me after hours  [facility] Office  [phone]

## 2016-11-12 NOTE — Plan of Care (Signed)
Problem: Pain Managment: Goal: General experience of comfort will improve Outcome: Progressing Pain under control

## 2016-11-12 NOTE — Progress Notes (Signed)
  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  ? If he needs cardiac eval with recurrent vol overload  ACCESS- AVF Left arm  Thrill and bruit  Dm  Continue home medications  Weight Loss  CT chest shows adenopathy  Could consider PET scan  Will defer to admitting ME   Subjective:     For HD again today   Objective:   BP 138/67 (BP Location: Right Arm)   Pulse 70   Temp 98.5 F (36.9 C) (Oral)   Resp 20   Wt 111.2 kg (245 lb 2.4 oz)   SpO2 98%   BMI 38.48 kg/m   Physical Exam: GEN NAD, eating lunch HEENT EOMI, PERRL wearing O2 NECK + JVD PULM mildly increased WOB CV RRR ABD nondistended EXT 2+ LE edema  Labs: BMET  Recent Labs Lab 11/11/16 1202 11/11/16 1730 11/12/16 1124  NA 137  --  138  K 5.2* 5.0 4.5  CL 103  --  102  CO2 29  --  27  GLUCOSE 193*  --  107*  BUN 27*  --  17  CREATININE 5.49*  --  4.23*  CALCIUM 9.3  --  8.6*   CBC  Recent Labs Lab 11/11/16 1202  WBC 7.8  HGB 10.4*  HCT 34.3*  MCV 97.7  PLT 286    @IMGRELPRIORS @ Medications:    . aspirin EC  81 mg Oral Daily  . calcium carbonate  3 tablet Oral TID  . Chlorhexidine Gluconate Cloth  6 each Topical Q0600  . DULoxetine  60 mg Oral Daily  . folic acid  1 mg Oral Daily  . heparin  5,000 Units Subcutaneous Q8H  . insulin aspart  0-9 Units Subcutaneous TID WC  . insulin detemir  15 Units Subcutaneous BID  . isosorbide mononitrate  120 mg Oral Daily  . labetalol  300 mg Oral BID  . LORazepam  0-4 mg Intravenous Q6H   Followed by  . [START ON 11/13/2016] LORazepam  0-4 mg Intravenous Q12H  . multivitamin with minerals  1 tablet Oral Daily  . mupirocin ointment  1 application Nasal BID  . nicotine  21 mg  Transdermal Daily  . NIFEdipine  60 mg Oral BID  . pantoprazole  40 mg Oral Daily  . sodium chloride flush  3 mL Intravenous Q12H  . thiamine  100 mg Oral Daily  . torsemide  20 mg Oral BID     Madelon Lips MD [facility] pgr [phone] 11/12/2016, 1:47 PM

## 2016-11-12 NOTE — Progress Notes (Signed)
Dr. Ree Kida paged regarding pt c/o sharp chest pain on left side. Nitroglycerin x 2 administered. EKG ordered.

## 2016-11-12 NOTE — Progress Notes (Signed)
Pt picked up by transport for dialysis. Pt in stable condition.

## 2016-11-12 NOTE — Progress Notes (Signed)
  Echocardiogram 2D Echocardiogram has been performed.  Jon Russell 11/12/2016, 2:43 PM

## 2016-11-13 DIAGNOSIS — R0602 Shortness of breath: Secondary | ICD-10-CM | POA: Diagnosis not present

## 2016-11-13 DIAGNOSIS — J9621 Acute and chronic respiratory failure with hypoxia: Secondary | ICD-10-CM | POA: Diagnosis not present

## 2016-11-13 DIAGNOSIS — G4733 Obstructive sleep apnea (adult) (pediatric): Secondary | ICD-10-CM

## 2016-11-13 DIAGNOSIS — J810 Acute pulmonary edema: Secondary | ICD-10-CM | POA: Diagnosis not present

## 2016-11-13 DIAGNOSIS — I5033 Acute on chronic diastolic (congestive) heart failure: Secondary | ICD-10-CM | POA: Diagnosis not present

## 2016-11-13 DIAGNOSIS — I132 Hypertensive heart and chronic kidney disease with heart failure and with stage 5 chronic kidney disease, or end stage renal disease: Secondary | ICD-10-CM | POA: Diagnosis not present

## 2016-11-13 LAB — BASIC METABOLIC PANEL
Anion gap: 10 (ref 5–15)
BUN: 13 mg/dL (ref 6–20)
CHLORIDE: 96 mmol/L — AB (ref 101–111)
CO2: 29 mmol/L (ref 22–32)
CREATININE: 3.48 mg/dL — AB (ref 0.61–1.24)
Calcium: 9.1 mg/dL (ref 8.9–10.3)
GFR calc Af Amer: 21 mL/min — ABNORMAL LOW (ref >60)
GFR calc non Af Amer: 18 mL/min — ABNORMAL LOW (ref >60)
GLUCOSE: 206 mg/dL — AB (ref 65–99)
POTASSIUM: 3.8 mmol/L (ref 3.5–5.1)
Sodium: 135 mmol/L (ref 135–145)

## 2016-11-13 LAB — HEPATITIS B SURFACE ANTIGEN: Hepatitis B Surface Ag: NEGATIVE

## 2016-11-13 LAB — GLUCOSE, CAPILLARY
GLUCOSE-CAPILLARY: 161 mg/dL — AB (ref 65–99)
Glucose-Capillary: 180 mg/dL — ABNORMAL HIGH (ref 65–99)

## 2016-11-13 LAB — CBC
HEMATOCRIT: 36.6 % — AB (ref 39.0–52.0)
Hemoglobin: 11.4 g/dL — ABNORMAL LOW (ref 13.0–17.0)
MCH: 30 pg (ref 26.0–34.0)
MCHC: 31.1 g/dL (ref 30.0–36.0)
MCV: 96.3 fL (ref 78.0–100.0)
Platelets: 284 10*3/uL (ref 150–400)
RBC: 3.8 MIL/uL — ABNORMAL LOW (ref 4.22–5.81)
RDW: 14.5 % (ref 11.5–15.5)
WBC: 7.6 10*3/uL (ref 4.0–10.5)

## 2016-11-13 LAB — TROPONIN I: Troponin I: <0.03 ng/mL (ref <0.03)

## 2016-11-13 MED ORDER — NICOTINE 21 MG/24HR TD PT24: 21.0000 mg | MEDICATED_PATCH | Freq: Every day | TRANSDERMAL | 0 refills | Status: DC

## 2016-11-13 NOTE — Discharge Summary (Signed)
Physician Discharge Summary  Jon Russell ZOX:[id number] DOB: 1960-07-08 DOA: 11/11/2016  PCP: Verner Chol, MD  Admit date: 11/11/2016 Discharge date: 11/13/2016  Time spent: 45 minutes  Recommendations for Outpatient Follow-up:  Patient will be discharged to home.  Patient will need to follow up with primary care provider within one week of discharge.  Continue hemodialysis as scheduled. Patient should continue medications as prescribed.  Patient should follow a renal/carb modified diet with 1278ml fluid restriction per day.   Discharge Diagnoses:  Acute on chronic hypoxic respiratory failure  Chest pain/ History  Acute on chronic diastolic heart failure and pulmonary edema ESRD COPD Essential Hypertension Polysubstance abuse Depression Adenopathy-chest GERD IDDM  Discharge Condition: Stable  Diet recommendation: renal/carb modified diet with 1255ml fluid restriction per day  [facility] Weights   11/11/16 2200 11/12/16 1734 11/12/16 2146  Weight: 111.2 kg (245 lb 2.4 oz) 111.9 kg (246 lb 11.1 oz) 105.7 kg (233 lb 0.4 oz)    History of present illness:  on 11/11/2016 by Dr. Malva Cogan Burrisis a 56 y.o.malewith medical history significant of ESRD-HD (TTS), hypertension, hyperlipidemia, diabetes mellitus, COPD, chronic respiratory failure on 2 L oxygen at night, GERD, OSA not using CPAP, depression, CAD, dCHF, anemia, chronic back pain, polysubstance abuse (tobacco, alcohol and cocaine), who presents with shortness of breath.  Patient states that he has been having shortness of breath in the past 3 weeks, which has been progressively getting worse. He is using 2 L oxygen at night at home, whichneeds to be increased to 3-4 L. Patient has cough with yellow colored mucus production, denies chest pain, but stated that he has chest pressure. Has chills, but no fever. Patient states that he has been compliant to his dialysis. Last dialysis was yesterday. Patient states that  he had diarrhea yesterday, which has resolved today. Currently no nausea, vomiting, diarrhea or abdominal pain. No symptoms of UTI or unilateral weakness.  Hospital Course:  Acute on chronic hypoxic respiratory failure  -Resolved -CXR showed pulm vascular congestion, mild pleural effusions -CTA chest: showed cardiomegaly with pleural effusion, pulm edema. Negative for PE -continue supplemental oxygen to maintain O2 saturations above 92% -patient uses oxygen 2L at home nightly and as needed  Chest pain/ History  -patient complaining of chest pain, EKG obtained. Discussed EKG with Dr. Radford Pax, cardiology, no change. -patient given nitro and maalox with some relief of pain -continue to monitor closely -troponin currently being cycled, negative x3  Acute on chronic diastolic heart failure and pulmonary edema -continue volume control with HD -last echocardiogram in 2012 showed EF 81-19%, grade 2 diastolic dysfunction -repeat echocardiogram: EF 60-65%  ESRD -dialyzes on TTS in [facility] -Nephrology consulted and appreciated -patient dialyzed on 10/31, 11/1 -discussed with nephrology, new dry weight 105.5kg  COPD -no wheezing noted -continue albuterol nebs PRN  Essential Hypertension -Continue labetalol, nifedipine, torsemide  Polysubstance abuse -UDS +opiates   Depression -stable, continue cymbalta  IDDM -continue home regimen  Adenopathy-chest -CTA chest scattered adenopathy in the chest, including right hilum, left lower paratracheal region, increased from 10/22/2015. Etiology not clear.  -Will need to follow up with PCP  GERD -continue PPI  Right hand/wrist pain -patient fell over one month ago, XRay showed no acute injury  Consultants Nephrology  Cardiology, Dr. Radford Pax  Procedures  None      Discharge Exam: Vitals:   11/13/16 0620 11/13/16 0931  BP: (!) 167/65 (!) 180/75  Pulse: 70 65  Resp: 16   Temp: 98 F (  36.7 C)   SpO2: 95% 93%    Patient states he is feeling better. Denies chest pain. Feels breathing is back to his baseline. Denies abdominal pain, nausea, vomiting, diarrhea, constipation, dizziness, headache.    General: Well developed, well nourished, NAD, appears stated age  1: NCAT, mucous membranes moist.  Cardiovascular: S1 S2 auscultated, RRR, no murmurs  Respiratory: Clear to auscultation bilaterally with equal chest rise  Abdomen: Soft, nontender, nondistended, + bowel sounds  Extremities: warm dry without cyanosis clubbing or edema  Neuro: AAOx3, nonfocal  Psych: Normal affect and demeanor  Discharge Instructions Discharge Instructions    Discharge instructions    Complete by:  As directed    Patient will be discharged to home.  Patient will need to follow up with primary care provider within one week of discharge.  Continue hemodialysis as scheduled. Patient should continue medications as prescribed.  Patient should follow a renal/carb modified diet with 1243ml fluid restriction per day.     Current Discharge Medication List    START taking these medications   Details  nicotine (NICODERM CQ - DOSED IN MG/24 HOURS) 21 mg/24hr patch Place 1 patch (21 mg total) onto the skin daily. Qty: 28 patch, Refills: 0      CONTINUE these medications which have NOT CHANGED   Details  acetaminophen (TYLENOL) 500 MG tablet Take 500 mg by mouth every 6 (six) hours as needed for mild pain.    albuterol (PROVENTIL HFA;VENTOLIN HFA) 108 (90 Base) MCG/ACT inhaler Inhale 2 puffs into the lungs every 6 (six) hours as needed for wheezing or shortness of breath.    aspirin 81 MG EC tablet Take 81 mg by mouth daily.     calcium carbonate (TUMS) 500 MG chewable tablet Chew 3 tablets by mouth 3 (three) times daily.    dorzolamide-timolol (COSOPT) 22.3-6.8 MG/ML ophthalmic solution Place 1 drop into both eyes 2 (two) times daily as needed (itching).     DULoxetine (CYMBALTA) 60 MG capsule Take 60 mg by mouth  daily.    insulin aspart (NOVOLOG) 100 UNIT/ML injection Inject 2-15 Units into the skin 3 (three) times daily before meals. Sliding scale    insulin detemir (LEVEMIR) 100 UNIT/ML injection Inject 20 Units into the skin 2 (two) times daily.     isosorbide mononitrate (IMDUR) 60 MG 24 hr tablet Take 2 tablets (120 mg total) by mouth daily. Qty: 180 tablet, Refills: 3    labetalol (NORMODYNE) 300 MG tablet Take 300 mg by mouth 2 (two) times daily.    NIFEdipine (PROCARDIA XL/ADALAT-CC) 60 MG 24 hr tablet Take 60 mg by mouth 2 (two) times daily.    pantoprazole (PROTONIX) 40 MG tablet Take 40 mg by mouth daily.     torsemide (DEMADEX) 20 MG tablet Take 20 mg by mouth 2 (two) times daily.     traZODone (DESYREL) 50 MG tablet Take 50-100 mg by mouth at bedtime as needed for sleep.      STOP taking these medications     carvedilol (COREG) 25 MG tablet      hydrALAZINE (APRESOLINE) 100 MG tablet        Allergies  Allergen Reactions  . Statins Other (See Comments)    Leg weakness   Follow-up Information    Verner Chol, MD. Schedule an appointment as soon as possible for a visit in 1 week(s).   Specialty:  Internal Medicine Why:  Hospital follow up Contact information: [address] [place] [address]  [phone]            The results of significant diagnostics from this hospitalization (including imaging, microbiology, ancillary and laboratory) are listed below for reference.    Significant Diagnostic Studies: Dg Chest 2 View  Result Date: 11/11/2016 CLINICAL DATA:  Chest pain, shortness of breath. EXAM: CHEST  2 VIEW COMPARISON:  Radiographs of November 03, 2016. FINDINGS: Stable cardiomediastinal silhouette. No pneumothorax is noted. Mild central pulmonary vascular congestion is noted with probable bibasilar pulmonary edema and mild pleural effusions. Bony thorax is unremarkable. IMPRESSION: Mild central pulmonary vascular congestion with probable  bibasilar pulmonary edema and mild pleural effusions. Electronically Signed   By: Marijo Conception, M.D.   On: 11/11/2016 12:39   Dg Wrist Complete Right  Result Date: 11/11/2016 CLINICAL DATA:  Status post fall 1 month ago. Wrist pain and stiffness EXAM: RIGHT WRIST - COMPLETE 3+ VIEW COMPARISON:  None. FINDINGS: There is no evidence of fracture or dislocation. There is no evidence of arthropathy or other focal bone abnormality. Soft tissues are unremarkable. Peripheral vascular atherosclerotic disease. IMPRESSION: No acute osseous injury of the right wrist. Electronically Signed   By: Kathreen Devoid   On: 11/11/2016 16:56   Ct Angio Chest Pe W And/or Wo Contrast  Result Date: 11/11/2016 CLINICAL DATA:  Shortness of breath for 1 week. COPD. Hypertension. Diabetes. EXAM: CT ANGIOGRAPHY CHEST WITH CONTRAST TECHNIQUE: Multidetector CT imaging of the chest was performed using the standard protocol during bolus administration of intravenous contrast. Multiplanar CT image reconstructions and MIPs were obtained to evaluate the vascular anatomy. CONTRAST:  70 cc Isovue 370 COMPARISON:  Multiple exams, including chest CT 10/22/2015 FINDINGS: Cardiovascular: No filling defect is identified in the pulmonary arterial tree to suggest pulmonary embolus. Atherosclerotic calcification of the aortic arch and coronary arteries. Moderate cardiomegaly. Mild pericardial effusion. Left axillary stent. Mediastinum/Nodes: Nodule inferior to the right thyroid lobe, possibly extending from the thyroid or possibly representing a lymph node, 1.3 by 2.2 cm, previously 1.2 by 1.8 cm on 10/22/2015. Right hilar adenopathy the including a 1.6 cm right hilar node on image 53/6. Left lower paratracheal adenopathy including a 1.8 cm node on image 52/6 (formerly 1.4 cm) prominent infrahilar nodal tissue. Lungs/Pleura: Moderate left and small right pleural effusions. Patchy ground-glass opacities in both lungs. If new indistinct pulmonary nodule  in the right upper lobe 0.7 by 0.6 cm on image 67/5. Stable 0.4 cm right middle lobe nodule on image 76 78/7. Atelectasis in both lower lobes. Bilateral airway thickening. Secondary pulmonary lobular interstitial accentuation most notable at the lung bases. Thickening of the fissures by fluid. Upper Abdomen: Unremarkable Musculoskeletal: Unremarkable Review of the MIP images confirms the above findings. IMPRESSION: 1. Cardiomegaly with pleural effusions and bilateral patchy airspace opacities favoring acute pulmonary edema. 2. No filling defect is identified in the pulmonary arterial tree to suggest pulmonary embolus. 3. Small pericardial effusion. 4. Scattered adenopathy in the chest, including the right hilum and left lower paratracheal region, increased from 10/22/2015, could be due to congestion from CHF, or pathologic adenopathy from otherwise unknown neoplasm. 5.  Aortic Atherosclerosis (ICD10-I70.0).  Coronary atherosclerosis. Electronically Signed   By: Van Clines M.D.   On: 11/11/2016 18:51    Microbiology: Recent Results (from the past 240 hour(s))  MRSA PCR Screening     Status: Abnormal   Collection Time: 11/12/16  5:15 AM  Result Value Ref Range Status   MRSA by PCR POSITIVE (A) NEGATIVE Final    Comment:  The GeneXpert MRSA Assay (FDA approved for NASAL specimens only), is one component of a comprehensive MRSA colonization surveillance program. It is not intended to diagnose MRSA infection nor to guide or monitor treatment for MRSA infections. RESULT CALLED TO, READ BACK BY AND VERIFIED WITH: P. CONTY [id number] 11.01.2018 N. MORRIS      Labs: Basic Metabolic Panel:  Recent Labs Lab 11/11/16 1202 11/11/16 1730 11/12/16 1124 11/12/16 1550 11/13/16 0321  NA 137  --  138 135 135  K 5.2* 5.0 4.5 4.6 3.8  CL 103  --  102 100* 96*  CO2 29  --  27 25 29   GLUCOSE 193*  --  107* 196* 206*  BUN 27*  --  17 21* 13  CREATININE 5.49*  --  4.23* 4.63* 3.48*  CALCIUM  9.3  --  8.6* 8.4* 9.1  PHOS  --   --   --  4.5  --    Liver Function Tests:  Recent Labs Lab 11/12/16 1550  ALBUMIN 3.2*   No results for input(s): LIPASE, AMYLASE in the last 168 hours. No results for input(s): AMMONIA in the last 168 hours. CBC:  Recent Labs Lab 11/11/16 1202 11/13/16 0321  WBC 7.8 7.6  HGB 10.4* 11.4*  HCT 34.3* 36.6*  MCV 97.7 96.3  PLT 286 284   Cardiac Enzymes:  Recent Labs Lab 11/11/16 2143 11/12/16 1124 11/13/16 0834  TROPONINI <0.03 <0.03 <0.03   BNP: BNP (last 3 results) No results for input(s): BNP in the last 8760 hours.  ProBNP (last 3 results) No results for input(s): PROBNP in the last 8760 hours.  CBG:  Recent Labs Lab 11/12/16 0757 11/12/16 1155 11/12/16 1645 11/12/16 2236 11/13/16 0811  GLUCAP 206* 118* 190* 127* 180*       Signed:  Lathen Seal  [facility] 11/13/2016, 10:48 AM

## 2016-11-13 NOTE — Progress Notes (Signed)
  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  ? If he needs cardiac eval with recurrent vol overload-- appears to be some nonadherence as well  ACCESS- AVF Left arm  Thrill and bruit  Dm  Continue home medications  Weight Loss  CT chest shows adenopathy  Could consider PET scan; could be completed as OP  Subjective:    New EDW 105.5.  OK for d/c today.     Objective:   BP 140/81 (BP Location: Right Arm)   Pulse 69   Temp 98.6 F (37 C) (Oral)   Resp 18   Wt 105.7 kg (233 lb 0.4 oz)   SpO2 99%   BMI 36.57 kg/m   Physical Exam: GEN NAD, sleeping, easily arousable HEENT EOMI, PERRL wearing O2 NECK no JVD PULM couple of expiratory wheezes CV RRR ABD nondistended EXT no LE edema  Labs: BMET  Recent Labs Lab 11/11/16 1202 11/11/16 1730 11/12/16 1124 11/12/16 1550 11/13/16 0321  NA 137  --  138 135 135  K 5.2* 5.0 4.5 4.6 3.8  CL 103  --  102 100* 96*  CO2 29  --  27 25 29   GLUCOSE 193*  --  107* 196* 206*  BUN 27*  --  17 21* 13  CREATININE 5.49*  --  4.23* 4.63* 3.48*  CALCIUM 9.3  --  8.6* 8.4* 9.1  PHOS  --   --   --  4.5  --    CBC  Recent Labs Lab 11/11/16 1202 11/13/16 0321  WBC 7.8 7.6  HGB 10.4* 11.4*  HCT 34.3* 36.6*  MCV 97.7 96.3  PLT 286 284    @IMGRELPRIORS @ Medications:    . aspirin EC  81 mg Oral Daily  . calcium carbonate  3 tablet Oral TID  . Chlorhexidine Gluconate Cloth  6 each Topical Q0600  . DULoxetine  60 mg Oral Daily  . folic acid  1 mg Oral Daily  . heparin  5,000 Units Subcutaneous Q8H  . insulin aspart  0-9 Units Subcutaneous TID WC  . insulin detemir  15 Units Subcutaneous BID  . isosorbide mononitrate  120 mg Oral Daily  . labetalol  300 mg Oral BID  .  LORazepam  0-4 mg Intravenous Q6H   Followed by  . LORazepam  0-4 mg Intravenous Q12H  . multivitamin with minerals  1 tablet Oral Daily  . mupirocin ointment  1 application Nasal BID  . nicotine  21 mg Transdermal Daily  . NIFEdipine  60 mg Oral BID  . pantoprazole  40 mg Oral Daily  . sodium chloride flush  3 mL Intravenous Q12H  . thiamine  100 mg Oral Daily  . torsemide  20 mg Oral BID     Madelon Lips MD [facility] pgr [phone] 11/13/2016, 12:00 PM

## 2016-11-13 NOTE — Progress Notes (Signed)
Patient requested to have all four bed rails up. RN educated patient about how implementation considered as restraints. Patient verbalized understanding and that he feels "safe" with all four rails up. RN will implement and continue to monitor patient.  Ermalinda Memos, RN

## 2016-11-13 NOTE — Discharge Instructions (Signed)
Pulmonary Edema °Pulmonary edema is abnormal fluid buildup in the lungs that can make it hard to breathe. °Follow these instructions at home: °· Talk to your doctor about an exercise program. °· Eat a healthy diet: °? Eat fresh fruits, vegetables, and lean meats. °? Limit high fat and salty foods. °? Avoid processed, canned, or fried foods. °? Avoid fast food. °· Follow your doctor's advice about taking medicine and recording the medicine you take. °· Follow your doctor's advice about keeping a record of your weight. °· Talk to your doctor about keeping track of your blood pressure. °· Do not smoke. °· Do not use nicotine patches or nicotine gum. °· Make a follow-up appointment with your doctor. °· Ask your doctor for a copy of your latest heart tracing (ECG) and keep a copy with you at all times. °Get help right away if: °· You have chest pain. THIS IS AN EMERGENCY. Do not wait to see if the pain will go away. Call for local emergency medical help. Do not drive yourself to the hospital. °· You have sweating, feel sick to your stomach (nauseous), or are experiencing shortness of breath. °· Your weight increases more than your doctor tells you it should. °· You start to have shortness of breath. °· You notice more swelling in your hands, feet, ankles, or belly. °· You have dizziness, blurred vision, headache, or unsteadiness that does not go away. °· You cough up bloody spit. °· You have a cough that does not go away. °· You are unable to sleep because it is hard to breathe. °· You begin to feel a “jumping” or “fluttering” sensation (palpitations) in the chest that is unusual for you. °This information is not intended to replace advice given to you by your health care provider. Make sure you discuss any questions you have with your health care provider. °Document Released: 12/17/2008 Document Revised: 06/06/2015 Document Reviewed: 09/05/2012 °Elsevier Interactive Patient Education © 2018 [employer] ° °

## 2018-02-04 HISTORY — PX: LAPAROSCOPIC CHOLECYSTECTOMY: SUR755

## 2018-02-09 HISTORY — PX: URETERAL STENT PLACEMENT: SHX822

## 2018-02-20 ENCOUNTER — Other Ambulatory Visit: Payer: Self-pay

## 2018-02-20 ENCOUNTER — Emergency Department (HOSPITAL_COMMUNITY): Payer: Medicare PPO

## 2018-02-20 ENCOUNTER — Encounter (HOSPITAL_COMMUNITY): Payer: Self-pay | Admitting: Emergency Medicine

## 2018-02-20 ENCOUNTER — Observation Stay (HOSPITAL_COMMUNITY)
Admission: EM | Admit: 2018-02-20 | Discharge: 2018-02-22 | Disposition: A | Discharge status: Home / Self Care | Payer: Medicare PPO | Attending: Internal Medicine | Admitting: Internal Medicine

## 2018-02-20 DIAGNOSIS — I2510 Atherosclerotic heart disease of native coronary artery without angina pectoris: Secondary | ICD-10-CM | POA: Insufficient documentation

## 2018-02-20 DIAGNOSIS — J449 Chronic obstructive pulmonary disease, unspecified: Secondary | ICD-10-CM

## 2018-02-20 DIAGNOSIS — I132 Hypertensive heart and chronic kidney disease with heart failure and with stage 5 chronic kidney disease, or end stage renal disease: Secondary | ICD-10-CM | POA: Insufficient documentation

## 2018-02-20 DIAGNOSIS — Z833 Family history of diabetes mellitus: Secondary | ICD-10-CM | POA: Diagnosis not present

## 2018-02-20 DIAGNOSIS — I159 Secondary hypertension, unspecified: Secondary | ICD-10-CM

## 2018-02-20 DIAGNOSIS — N136 Pyonephrosis: Secondary | ICD-10-CM | POA: Insufficient documentation

## 2018-02-20 DIAGNOSIS — I10 Essential (primary) hypertension: Secondary | ICD-10-CM | POA: Diagnosis present

## 2018-02-20 DIAGNOSIS — D631 Anemia in chronic kidney disease: Secondary | ICD-10-CM | POA: Insufficient documentation

## 2018-02-20 DIAGNOSIS — Z992 Dependence on renal dialysis: Secondary | ICD-10-CM | POA: Diagnosis not present

## 2018-02-20 DIAGNOSIS — G4733 Obstructive sleep apnea (adult) (pediatric): Secondary | ICD-10-CM | POA: Insufficient documentation

## 2018-02-20 DIAGNOSIS — Z8249 Family history of ischemic heart disease and other diseases of the circulatory system: Secondary | ICD-10-CM | POA: Diagnosis not present

## 2018-02-20 DIAGNOSIS — Z888 Allergy status to other drugs, medicaments and biological substances status: Secondary | ICD-10-CM | POA: Insufficient documentation

## 2018-02-20 DIAGNOSIS — Z794 Long term (current) use of insulin: Secondary | ICD-10-CM

## 2018-02-20 DIAGNOSIS — E11319 Type 2 diabetes mellitus with unspecified diabetic retinopathy without macular edema: Secondary | ICD-10-CM | POA: Insufficient documentation

## 2018-02-20 DIAGNOSIS — F1910 Other psychoactive substance abuse, uncomplicated: Secondary | ICD-10-CM | POA: Insufficient documentation

## 2018-02-20 DIAGNOSIS — E1122 Type 2 diabetes mellitus with diabetic chronic kidney disease: Secondary | ICD-10-CM | POA: Diagnosis not present

## 2018-02-20 DIAGNOSIS — N186 End stage renal disease: Secondary | ICD-10-CM

## 2018-02-20 DIAGNOSIS — E785 Hyperlipidemia, unspecified: Secondary | ICD-10-CM | POA: Diagnosis not present

## 2018-02-20 DIAGNOSIS — F17210 Nicotine dependence, cigarettes, uncomplicated: Secondary | ICD-10-CM | POA: Diagnosis not present

## 2018-02-20 DIAGNOSIS — R9431 Abnormal electrocardiogram [ECG] [EKG]: Secondary | ICD-10-CM | POA: Diagnosis not present

## 2018-02-20 DIAGNOSIS — R748 Abnormal levels of other serum enzymes: Secondary | ICD-10-CM | POA: Insufficient documentation

## 2018-02-20 DIAGNOSIS — Z720 Tobacco use: Secondary | ICD-10-CM | POA: Diagnosis present

## 2018-02-20 DIAGNOSIS — H548 Legal blindness, as defined in USA: Secondary | ICD-10-CM | POA: Diagnosis not present

## 2018-02-20 DIAGNOSIS — N390 Urinary tract infection, site not specified: Secondary | ICD-10-CM

## 2018-02-20 DIAGNOSIS — Z8349 Family history of other endocrine, nutritional and metabolic diseases: Secondary | ICD-10-CM | POA: Insufficient documentation

## 2018-02-20 DIAGNOSIS — J810 Acute pulmonary edema: Principal | ICD-10-CM

## 2018-02-20 DIAGNOSIS — Z9114 Patient's other noncompliance with medication regimen: Secondary | ICD-10-CM | POA: Insufficient documentation

## 2018-02-20 DIAGNOSIS — E119 Type 2 diabetes mellitus without complications: Secondary | ICD-10-CM

## 2018-02-20 DIAGNOSIS — N3001 Acute cystitis with hematuria: Secondary | ICD-10-CM

## 2018-02-20 DIAGNOSIS — F329 Major depressive disorder, single episode, unspecified: Secondary | ICD-10-CM | POA: Diagnosis not present

## 2018-02-20 DIAGNOSIS — I5032 Chronic diastolic (congestive) heart failure: Secondary | ICD-10-CM | POA: Diagnosis not present

## 2018-02-20 DIAGNOSIS — Z79899 Other long term (current) drug therapy: Secondary | ICD-10-CM | POA: Insufficient documentation

## 2018-02-20 DIAGNOSIS — B9689 Other specified bacterial agents as the cause of diseases classified elsewhere: Secondary | ICD-10-CM | POA: Insufficient documentation

## 2018-02-20 DIAGNOSIS — R52 Pain, unspecified: Secondary | ICD-10-CM

## 2018-02-20 DIAGNOSIS — J90 Pleural effusion, not elsewhere classified: Secondary | ICD-10-CM | POA: Diagnosis present

## 2018-02-20 DIAGNOSIS — K915 Postcholecystectomy syndrome: Secondary | ICD-10-CM | POA: Diagnosis not present

## 2018-02-20 DIAGNOSIS — K219 Gastro-esophageal reflux disease without esophagitis: Secondary | ICD-10-CM | POA: Insufficient documentation

## 2018-02-20 DIAGNOSIS — Z9115 Patient's noncompliance with renal dialysis: Secondary | ICD-10-CM | POA: Insufficient documentation

## 2018-02-20 DIAGNOSIS — Z9049 Acquired absence of other specified parts of digestive tract: Secondary | ICD-10-CM | POA: Insufficient documentation

## 2018-02-20 DIAGNOSIS — Z79891 Long term (current) use of opiate analgesic: Secondary | ICD-10-CM | POA: Insufficient documentation

## 2018-02-20 DIAGNOSIS — K9189 Other postprocedural complications and disorders of digestive system: Secondary | ICD-10-CM

## 2018-02-20 DIAGNOSIS — R935 Abnormal findings on diagnostic imaging of other abdominal regions, including retroperitoneum: Secondary | ICD-10-CM

## 2018-02-20 DIAGNOSIS — Z7982 Long term (current) use of aspirin: Secondary | ICD-10-CM | POA: Insufficient documentation

## 2018-02-20 HISTORY — DX: End stage renal disease: Z99.2

## 2018-02-20 HISTORY — DX: End stage renal disease: N18.6

## 2018-02-20 LAB — URINALYSIS, ROUTINE W REFLEX MICROSCOPIC
Bilirubin Urine: NEGATIVE
Glucose, UA: NEGATIVE mg/dL
Ketones, ur: NEGATIVE mg/dL
Nitrite: NEGATIVE
Protein, ur: 100 mg/dL — AB
Specific Gravity, Urine: 1.016 (ref 1.005–1.030)
WBC, UA: >50 WBC/hpf — ABNORMAL HIGH (ref 0–5)
pH: 5 (ref 5.0–8.0)

## 2018-02-20 LAB — CBC
HCT: 33.2 % — ABNORMAL LOW (ref 39.0–52.0)
Hemoglobin: 9.7 g/dL — ABNORMAL LOW (ref 13.0–17.0)
MCH: 29.1 pg (ref 26.0–34.0)
MCHC: 29.2 g/dL — ABNORMAL LOW (ref 30.0–36.0)
MCV: 99.7 fL (ref 80.0–100.0)
Platelets: 390 10*3/uL (ref 150–400)
RBC: 3.33 MIL/uL — ABNORMAL LOW (ref 4.22–5.81)
RDW: 13.7 % (ref 11.5–15.5)
WBC: 10.2 10*3/uL (ref 4.0–10.5)
nRBC: 0 % (ref 0.0–0.2)

## 2018-02-20 LAB — HEPATIC FUNCTION PANEL
ALT: 14 U/L (ref 0–44)
AST: 14 U/L — ABNORMAL LOW (ref 15–41)
Albumin: 2.9 g/dL — ABNORMAL LOW (ref 3.5–5.0)
Alkaline Phosphatase: 86 U/L (ref 38–126)
Bilirubin, Direct: <0.1 mg/dL (ref 0.0–0.2)
Total Bilirubin: 0.5 mg/dL (ref 0.3–1.2)
Total Protein: 7.1 g/dL (ref 6.5–8.1)

## 2018-02-20 LAB — BASIC METABOLIC PANEL
Anion gap: 15 (ref 5–15)
BUN: 63 mg/dL — ABNORMAL HIGH (ref 6–20)
CO2: 18 mmol/L — ABNORMAL LOW (ref 22–32)
CREATININE: 10.47 mg/dL — AB (ref 0.61–1.24)
Calcium: 8.5 mg/dL — ABNORMAL LOW (ref 8.9–10.3)
Chloride: 107 mmol/L (ref 98–111)
GFR calc Af Amer: 6 mL/min — ABNORMAL LOW (ref >60)
GFR calc non Af Amer: 5 mL/min — ABNORMAL LOW (ref >60)
Glucose, Bld: 184 mg/dL — ABNORMAL HIGH (ref 70–99)
Potassium: 3.9 mmol/L (ref 3.5–5.1)
Sodium: 140 mmol/L (ref 135–145)

## 2018-02-20 LAB — I-STAT TROPONIN, ED: TROPONIN I, POC: 0.03 ng/mL (ref 0.00–0.08)

## 2018-02-20 LAB — LIPASE, BLOOD: Lipase: 112 U/L — ABNORMAL HIGH (ref 11–51)

## 2018-02-20 LAB — TROPONIN I: Troponin I: 0.04 ng/mL (ref <0.03)

## 2018-02-20 MED ORDER — LABETALOL HCL 300 MG PO TABS
300.0000 mg | ORAL_TABLET | Freq: Two times a day (BID) | ORAL | Status: DC
  Administered 2018-02-21 (×3): 300 mg via ORAL
  Filled 2018-02-20 (×4): qty 1

## 2018-02-20 MED ORDER — ASPIRIN EC 81 MG PO TBEC
81.0000 mg | DELAYED_RELEASE_TABLET | Freq: Every day | ORAL | Status: DC
  Administered 2018-02-21: 81 mg via ORAL
  Filled 2018-02-20: qty 1

## 2018-02-20 MED ORDER — IOPAMIDOL (ISOVUE-370) INJECTION 76%
100.0000 mL | Freq: Once | INTRAVENOUS | Status: AC | PRN
  Administered 2018-02-20: 100 mL via INTRAVENOUS

## 2018-02-20 MED ORDER — SODIUM CHLORIDE 0.9% FLUSH
3.0000 mL | Freq: Once | INTRAVENOUS | Status: AC
  Administered 2018-02-20: 3 mL via INTRAVENOUS

## 2018-02-20 MED ORDER — OXYCODONE HCL 5 MG PO TABS
15.0000 mg | ORAL_TABLET | Freq: Four times a day (QID) | ORAL | Status: DC | PRN
  Administered 2018-02-21 – 2018-02-22 (×5): 15 mg via ORAL
  Filled 2018-02-20 (×4): qty 3

## 2018-02-20 MED ORDER — LEVOFLOXACIN 750 MG PO TABS: 750.0000 mg | ORAL_TABLET | Freq: Once | ORAL | Status: DC

## 2018-02-20 MED ORDER — INSULIN GLARGINE 100 UNIT/ML ~~LOC~~ SOLN: 12.0000 [IU] | Freq: Every day | SUBCUTANEOUS | Status: DC

## 2018-02-20 MED ORDER — INSULIN ASPART 100 UNIT/ML ~~LOC~~ SOLN: 0.0000 [IU] | Freq: Three times a day (TID) | SUBCUTANEOUS | Status: DC

## 2018-02-20 MED ORDER — SODIUM CHLORIDE 0.9 % IV SOLN
2.0000 g | Freq: Once | INTRAVENOUS | Status: AC
  Administered 2018-02-20: 2 g via INTRAVENOUS
  Filled 2018-02-20: qty 2

## 2018-02-20 MED ORDER — PANTOPRAZOLE SODIUM 40 MG PO TBEC
40.0000 mg | DELAYED_RELEASE_TABLET | Freq: Every day | ORAL | Status: DC
  Administered 2018-02-21 – 2018-02-22 (×2): 40 mg via ORAL
  Filled 2018-02-20 (×2): qty 1

## 2018-02-20 MED ORDER — ONDANSETRON HCL 4 MG/2ML IJ SOLN
4.0000 mg | Freq: Four times a day (QID) | INTRAMUSCULAR | Status: DC | PRN
  Administered 2018-02-21: 4 mg via INTRAVENOUS
  Filled 2018-02-20: qty 2

## 2018-02-20 MED ORDER — TRAZODONE HCL 50 MG PO TABS
50.0000 mg | ORAL_TABLET | Freq: Every day | ORAL | Status: DC
  Administered 2018-02-21: 50 mg via ORAL
  Filled 2018-02-20: qty 1

## 2018-02-20 MED ORDER — ISOSORBIDE MONONITRATE ER 60 MG PO TB24
120.0000 mg | ORAL_TABLET | Freq: Every day | ORAL | Status: DC
  Administered 2018-02-21 – 2018-02-22 (×2): 120 mg via ORAL
  Filled 2018-02-20 (×3): qty 2

## 2018-02-20 MED ORDER — CALCIUM CARBONATE ANTACID 750 MG PO CHEW: 2.0000 | CHEWABLE_TABLET | ORAL | Status: DC

## 2018-02-20 MED ORDER — DORZOLAMIDE HCL-TIMOLOL MAL 2-0.5 % OP SOLN
1.0000 [drp] | Freq: Two times a day (BID) | OPHTHALMIC | Status: DC
  Administered 2018-02-21 – 2018-02-22 (×3): 1 [drp] via OPHTHALMIC
  Filled 2018-02-20: qty 10

## 2018-02-20 MED ORDER — ACETAMINOPHEN 325 MG PO TABS: 650.0000 mg | ORAL_TABLET | Freq: Four times a day (QID) | ORAL | Status: DC | PRN

## 2018-02-20 MED ORDER — NIFEDIPINE ER OSMOTIC RELEASE 60 MG PO TB24
60.0000 mg | ORAL_TABLET | Freq: Every day | ORAL | Status: DC
  Administered 2018-02-21 – 2018-02-22 (×2): 60 mg via ORAL
  Filled 2018-02-20 (×3): qty 1

## 2018-02-20 MED ORDER — IOPAMIDOL (ISOVUE-370) INJECTION 76%
INTRAVENOUS | Status: AC
  Filled 2018-02-20: qty 100

## 2018-02-20 MED ORDER — IOPAMIDOL (ISOVUE-370) INJECTION 76%
INTRAVENOUS | Status: AC
  Filled 2018-02-20: qty 50

## 2018-02-20 MED ORDER — METOCLOPRAMIDE HCL 5 MG/ML IJ SOLN
10.0000 mg | Freq: Once | INTRAMUSCULAR | Status: AC
  Administered 2018-02-20: 10 mg via INTRAVENOUS
  Filled 2018-02-20: qty 2

## 2018-02-20 MED ORDER — TORSEMIDE 20 MG PO TABS
20.0000 mg | ORAL_TABLET | Freq: Two times a day (BID) | ORAL | Status: DC
  Administered 2018-02-21 – 2018-02-22 (×3): 20 mg via ORAL
  Filled 2018-02-20 (×3): qty 1

## 2018-02-20 MED ORDER — ACETAMINOPHEN 500 MG PO TABS: 1000.0000 mg | ORAL_TABLET | Freq: Four times a day (QID) | ORAL | Status: DC | PRN

## 2018-02-20 MED ORDER — INSULIN ASPART 100 UNIT/ML ~~LOC~~ SOLN: 3.0000 [IU] | Freq: Three times a day (TID) | SUBCUTANEOUS | Status: DC

## 2018-02-20 MED ORDER — DULOXETINE HCL 60 MG PO CPEP
60.0000 mg | ORAL_CAPSULE | Freq: Two times a day (BID) | ORAL | Status: DC
  Administered 2018-02-21 (×3): 60 mg via ORAL
  Filled 2018-02-20 (×4): qty 1

## 2018-02-20 MED ORDER — ACETAMINOPHEN 650 MG RE SUPP: 650.0000 mg | Freq: Four times a day (QID) | RECTAL | Status: DC | PRN

## 2018-02-20 MED ORDER — SODIUM CHLORIDE 0.9 % IV SOLN
2.0000 g | INTRAVENOUS | Status: DC
  Filled 2018-02-20: qty 2

## 2018-02-20 MED ORDER — INSULIN ASPART 100 UNIT/ML ~~LOC~~ SOLN
0.0000 [IU] | SUBCUTANEOUS | Status: DC
  Administered 2018-02-21: 2 [IU] via SUBCUTANEOUS
  Administered 2018-02-22: 1 [IU] via SUBCUTANEOUS

## 2018-02-20 MED ORDER — ONDANSETRON HCL 4 MG PO TABS
4.0000 mg | ORAL_TABLET | Freq: Four times a day (QID) | ORAL | Status: DC | PRN
  Administered 2018-02-22: 4 mg via ORAL
  Filled 2018-02-20: qty 1

## 2018-02-20 MED ORDER — NITROGLYCERIN 2 % TD OINT
1.0000 [in_us] | TOPICAL_OINTMENT | Freq: Four times a day (QID) | TRANSDERMAL | Status: DC
  Administered 2018-02-20 – 2018-02-22 (×6): 1 [in_us] via TOPICAL
  Filled 2018-02-20 (×6): qty 1

## 2018-02-20 MED ORDER — HEPARIN SODIUM (PORCINE) 5000 UNIT/ML IJ SOLN
5000.0000 [IU] | Freq: Three times a day (TID) | INTRAMUSCULAR | Status: DC
  Administered 2018-02-21 – 2018-02-22 (×3): 5000 [IU] via SUBCUTANEOUS
  Filled 2018-02-20 (×3): qty 1

## 2018-02-20 MED ORDER — MORPHINE SULFATE (PF) 4 MG/ML IV SOLN
4.0000 mg | Freq: Once | INTRAVENOUS | Status: AC
  Administered 2018-02-20: 4 mg via INTRAVENOUS
  Filled 2018-02-20: qty 1

## 2018-02-20 MED ORDER — INSULIN GLARGINE 100 UNIT/ML ~~LOC~~ SOLN
10.0000 [IU] | Freq: Every day | SUBCUTANEOUS | Status: DC
  Administered 2018-02-21 – 2018-02-22 (×2): 10 [IU] via SUBCUTANEOUS
  Filled 2018-02-20 (×3): qty 0.1

## 2018-02-20 MED ORDER — ALBUTEROL SULFATE (2.5 MG/3ML) 0.083% IN NEBU: 3.0000 mL | INHALATION_SOLUTION | Freq: Four times a day (QID) | RESPIRATORY_TRACT | Status: DC | PRN

## 2018-02-20 NOTE — ED Notes (Signed)
Istat troponin taken to lab at 2036.

## 2018-02-20 NOTE — H&P (Signed)
History and Physical    Jon Russell DJM:[id number] DOB: 1960/12/29 DOA: 02/20/2018  PCP: Verner Chol, MD  Patient coming from: Home  I have personally briefly reviewed patient's old medical records in [place]  Chief Complaint: SOB  HPI: Jon Russell is a 58 y.o. male with medical history significant of ESRD on TTS dialysis, CHF, COPD, DM, HTN, polysubstance abuse.  Patient was admitted to [facility] on 01/30/2018 with complaint of abdominal pain nausea and vomiting found to have evidence of chronic cholecystitis and underwent laparoscopic cholecystectomy on 02/04/2018.  He was then discharged but returned and was admitted for persistent nausea vomiting, chest pain, shortness of breath.  At that time it was discovered he had hydronephrosis and a right ureteral stent was placed by urology on 02/09/2018 with plan to remove in 2 to 3 weeks.  He presents to the ED today with c/o SOB.  This worsening over the past 6 days.  He missed Thursday and Sat dialysis ths past week, last session was on Tuesday.  SOB worse on exertion, associated intermittent substernal CP, sharp, pressure, radiates to L side of chest.  Sharp upper abd pain with nausea, one episode of NBNB emesis today.  Reports no BM in 1 week as well.  Reports worsening dysuria over past week or two.   ED Course: Found to have pulmonary edema, SBP 190s.  UA suggestive of UTI.  CT shows suspected UTI, also shows fluid collection around liver and cant rule out bile leak.  Hydronephrosis is resolved s/p ureteral stent placement.   Review of Systems: As per HPI otherwise 10 point review of systems negative.   Past Medical History:  Diagnosis Date  . Anemia   . Anginal pain ([place])   . Back pain   . Chronic diastolic CHF (congestive heart failure) ([place])   . COPD (chronic obstructive pulmonary disease) ([place])   . Coronary artery disease   . Depression   . Diabetes mellitus   . Diabetic retinopathy ([place])    Legally blind  .  GERD (gastroesophageal reflux disease)   . Hyperlipemia   . Hypertension    dr bensimhon  . Renal insufficiency    not on dialysis; 07/2011: baseline Cr 1.8-2.0  . Shortness of breath   . Sleep apnea    USES CPAP    Past Surgical History:  Procedure Laterality Date  . AV FISTULA PLACEMENT  05/05/2011   Procedure: ARTERIOVENOUS (AV) FISTULA CREATION;  Surgeon: Elam Dutch, MD;  Location: [facility];  Service: Vascular;  Laterality: Left;  Left Radial Cephalic Arteriovenous Fistula Creation  . EYE SURGERY     legally blind both eyes  . LUNG BIOPSY       reports that he has been smoking cigarettes. He has a 30.00 pack-year smoking history. He has never used smokeless tobacco. He reports current alcohol use. He reports current drug use. Drug: Cocaine.  Allergies  Allergen Reactions  . Statins Other (See Comments)    Leg weakness    Family History  Problem Relation Age of Onset  . Diabetes Mother   . Heart disease Mother   . Hyperlipidemia Mother   . Hypertension Mother   . Diabetes Father   . Heart disease Father   . Hyperlipidemia Father   . Hypertension Father   . Diabetes Sister   . Hyperlipidemia Sister   . Hypertension Sister   . Diabetes Brother   . Heart disease Brother   . Hyperlipidemia Brother   .  Hypertension Brother   . Heart attack Brother   . Other Brother        Amputation     Prior to Admission medications   Medication Sig Start Date End Date Taking? Authorizing Provider  acetaminophen (TYLENOL) 500 MG tablet Take 1,000 mg by mouth every 6 (six) hours as needed for mild pain.    Yes [provider]  albuterol (PROVENTIL HFA;VENTOLIN HFA) 108 (90 Base) MCG/ACT inhaler Inhale 2 puffs into the lungs every 6 (six) hours as needed for wheezing or shortness of breath.   Yes [provider]  aspirin 81 MG EC tablet Take 81 mg by mouth at bedtime.    Yes [provider]  calcium carbonate (TUMS EX) 750 MG chewable tablet Chew 2-3  tablets by mouth See admin instructions. Take 3 tablets by mouth with meals and 2 tablets with snacks   Yes [provider]  dorzolamide-timolol (COSOPT) 22.3-6.8 MG/ML ophthalmic solution Place 1 drop into both eyes 2 (two) times daily.    Yes [provider]  DULoxetine (CYMBALTA) 60 MG capsule Take 60 mg by mouth 2 (two) times daily.    Yes [provider]  insulin aspart (NOVOLOG FLEXPEN) 100 UNIT/ML FlexPen Inject 4-12 Units into the skin 3 (three) times daily as needed for high blood sugar (CBG 120). Per sliding scale based on CBG   Yes [provider]  Insulin Detemir (LEVEMIR FLEXTOUCH) 100 UNIT/ML Pen Inject 20 Units into the skin daily before breakfast.   Yes [provider]  isosorbide mononitrate (IMDUR) 120 MG 24 hr tablet Take 120 mg by mouth daily.   Yes [provider]  labetalol (NORMODYNE) 300 MG tablet Take 300 mg by mouth 2 (two) times daily.   Yes [provider]  NIFEdipine (PROCARDIA XL/ADALAT-CC) 60 MG 24 hr tablet Take 60 mg by mouth daily.  12/29/10  Yes Bensimhon, Shaune Pascal, MD  oxyCODONE (ROXICODONE) 15 MG immediate release tablet Take 15 mg by mouth every 6 (six) hours as needed for pain.   Yes [provider]  OXYGEN Inhale 3 L into the lungs continuous.   Yes [provider]  pantoprazole (PROTONIX) 40 MG tablet Take 40 mg by mouth daily.    Yes Bensimhon, Shaune Pascal, MD  torsemide (DEMADEX) 20 MG tablet Take 20 mg by mouth 2 (two) times daily.    Yes Wynetta Emery, PA-C  traZODone (DESYREL) 50 MG tablet Take 50-100 mg by mouth at bedtime.    Yes [provider]    Physical Exam: Vitals:   02/20/18 1845 02/20/18 1850 02/20/18 1900 02/20/18 2230  BP:  (!) 170/80 (!) 189/87 (!) 197/72  Pulse: 66 67 68 69  Resp: (!) 22 20 (!) 21 (!) 21  Temp:      TempSrc:      SpO2: 98% 98% 97% 96%    Constitutional: NAD, calm, comfortable Eyes: PERRL, lids and conjunctivae normal ENMT:  Mucous membranes are moist. Posterior pharynx clear of any exudate or lesions.Normal dentition.  Neck: normal, supple, no masses, no thyromegaly Respiratory: clear to auscultation bilaterally, no wheezing, no crackles. Normal respiratory effort. No accessory muscle use.  Cardiovascular: Regular rate and rhythm, no murmurs / rubs / gallops. No extremity edema. 2+ pedal pulses. No carotid bruits.  Abdomen: Diffuse, TTP, no rebound nor guarding Musculoskeletal: no clubbing / cyanosis. No joint deformity upper and lower extremities. Good ROM, no contractures. Normal muscle tone.  Skin: no rashes, lesions, ulcers. No induration  Neurologic: CN 2-12 grossly intact. Sensation intact, DTR normal. Strength 5/5 in all 4.  Psychiatric: Normal judgment and insight. Alert and oriented x 3. Normal mood.    Labs on Admission: I have personally reviewed following labs and imaging studies  CBC: Recent Labs  Lab 02/20/18 1851  WBC 10.2  HGB 9.7*  HCT 33.2*  MCV 99.7  PLT 388   Basic Metabolic Panel: Recent Labs  Lab 02/20/18 1851  NA 140  K 3.9  CL 107  CO2 18*  GLUCOSE 184*  BUN 63*  CREATININE 10.47*  CALCIUM 8.5*   GFR: CrCl cannot be calculated (Unknown ideal weight.). Liver Function Tests: Recent Labs  Lab 02/20/18 1851  AST 14*  ALT 14  ALKPHOS 86  BILITOT 0.5  PROT 7.1  ALBUMIN 2.9*   Recent Labs  Lab 02/20/18 1851  LIPASE 112*   No results for input(s): AMMONIA in the last 168 hours. Coagulation Profile: No results for input(s): INR, PROTIME in the last 168 hours. Cardiac Enzymes: No results for input(s): CKTOTAL, CKMB, CKMBINDEX, TROPONINI in the last 168 hours. BNP (last 3 results) No results for input(s): PROBNP in the last 8760 hours. HbA1C: No results for input(s): HGBA1C in the last 72 hours. CBG: No results for input(s): GLUCAP in the last 168 hours. Lipid Profile: No results for input(s): CHOL, HDL, LDLCALC, TRIG, CHOLHDL, LDLDIRECT in the last 72  hours. Thyroid Function Tests: No results for input(s): TSH, T4TOTAL, FREET4, T3FREE, THYROIDAB in the last 72 hours. Anemia Panel: No results for input(s): VITAMINB12, FOLATE, FERRITIN, TIBC, IRON, RETICCTPCT in the last 72 hours. Urine analysis:    Component Value Date/Time   COLORURINE YELLOW 02/20/2018 2054   APPEARANCEUR CLOUDY (A) 02/20/2018 2054   LABSPEC 1.016 02/20/2018 2054   PHURINE 5.0 02/20/2018 2054   GLUCOSEU NEGATIVE 02/20/2018 2054   HGBUR MODERATE (A) 02/20/2018 2054   BILIRUBINUR NEGATIVE 02/20/2018 2054   KETONESUR NEGATIVE 02/20/2018 2054   PROTEINUR 100 (A) 02/20/2018 2054   NITRITE NEGATIVE 02/20/2018 2054   LEUKOCYTESUR LARGE (A) 02/20/2018 2054    Radiological Exams on Admission: Dg Chest 2 View  Result Date: 02/20/2018 CLINICAL DATA:  Shortness of breath for 2-3 days, history CHF, has not been taking Lasix, smoker, COPD, diabetes mellitus, hypertension, hyperlipidemia, GERD EXAM: CHEST - 2 VIEW COMPARISON:  01/30/2018 FINDINGS: Enlargement of cardiac silhouette with pulmonary vascular congestion. Mild pulmonary edema. Bibasilar pleural effusions and atelectasis greater on LEFT. No pneumothorax. LEFT subclavian/axillary stent. Osseous structures unremarkable. Nephrostomy tube at upper abdomen on lateral view, not visualized on PA view. IMPRESSION: Enlargement of cardiac silhouette with pulmonary vascular congestion, pulmonary edema, and bibasilar pleural effusions/atelectasis LEFT greater than RIGHT. Electronically Signed   By: Lavonia Dana M.D.   On: 02/20/2018 19:20   Ct Angio Chest Pe W And/or Wo Contrast  Result Date: 02/20/2018 CLINICAL DATA:  Patient with shortness of breath, nausea and vomiting. EXAM: CT ANGIOGRAPHY CHEST CT ABDOMEN AND PELVIS WITH CONTRAST TECHNIQUE: Multidetector CT imaging of the chest was performed using the standard protocol during bolus administration of intravenous contrast. Multiplanar CT image reconstructions and MIPs were obtained to  evaluate the vascular anatomy. Multidetector CT imaging of the abdomen and pelvis was performed using the standard protocol during bolus administration of intravenous contrast. CONTRAST:  156mL ISOVUE-370 IOPAMIDOL (ISOVUE-370) INJECTION 76% COMPARISON:  CT abdomen pelvis 02/08/2018 FINDINGS: CTA CHEST FINDINGS Cardiovascular: Heart is enlarged. Small pericardial effusion. Thoracic aortic vascular calcifications. Main pulmonary artery is dilated as can be seen  with pulmonary arterial hypertension. Coronary arterial vascular calcifications. Adequate opacification of the main pulmonary artery. Motion artifact limits evaluation. No abnormal filling defects identified to suggest acute pulmonary embolus. Mediastinum/Nodes: 1.8 cm right paratracheal lymph node (image 47; series 5). 2.2 cm right hilar node (image 56; series 5). Small hiatal hernia. Otherwise normal appearance of the esophagus. Lungs/Pleura: Expiratory phase imaging. There is a large left and small right pleural effusion. Large area of consolidation throughout the left lower lobe. Patchy ground-glass opacities throughout the aerated left upper lobe. Patchy ground-glass opacities throughout the aerated right lung. 4 mm right middle lobe nodule (image 67; series 6). 5 mm right middle lobe nodule (image 75; series 6). Nodules are similar when compared to prior exam, suggestive of benign etiology. Interlobular septal thickening. Fluid along the fissures. No pneumothorax. Musculoskeletal: No aggressive or acute appearing osseous lesions. Stable sclerosis within the posterior right fifth rib. Review of the MIP images confirms the above findings. CT ABDOMEN and PELVIS FINDINGS Hepatobiliary: Liver is normal in size and contour. Re demonstrated 5.0 x 3.9 cm fluid collection within the gallbladder fossa (image 41; series 12). Interval development of small amount of perihepatic fluid (image 18; series 12). Mild heterogeneous enhancement of the adjacent hepatic  parenchyma (image 49; series 12). Fatty deposition adjacent to the falciform ligament. No intrahepatic biliary ductal dilatation. Pancreas: Unremarkable Spleen: Normal in size without focal abnormality. Adrenals/Urinary Tract: Normal adrenal glands. Kidneys enhance symmetrically with contrast. Interval insertion right double-J nephroureteral stent which appears in appropriate position. Marked wall thickening of the urinary bladder. Stomach/Bowel: No abnormal bowel wall thickening or evidence for bowel obstruction. No free fluid or free intraperitoneal air. Vascular/Lymphatic: Normal caliber abdominal aorta. Peripheral calcified atherosclerotic plaque. Prominent subcentimeter retroperitoneal lymph nodes. Reproductive: Heterogeneous prostate. Other: None. Musculoskeletal: No aggressive or acute appearing osseous lesions. Lumbar spine degenerative changes. Review of the MIP images confirms the above findings. IMPRESSION: Chest: 1. Large left and small right pleural effusions. Consolidation within the left lower lobe may represent atelectasis or infection. Patchy ground-glass opacities throughout the aerated lungs bilaterally favored to represent edema. Atypical infectious process not excluded. 2. No evidence for acute pulmonary embolus. 3. Similar-appearing mediastinal adenopathy, potentially reactive in etiology. Abdomen pelvis: 1. There is a residual fluid collection within the gallbladder fossa which is similar in size when compared to recent abdomen pelvic CT. Additionally, there is new perihepatic fluid. Possibility of bile leak not entirely excluded. Consider further evaluation with HIDA exam as clinically indicated. 2. Interval insertion right-sided double-J nephroureteral stent. No residual hydronephrosis. 3. Marked wall thickening of the urinary bladder. Recommend correlation with urinalysis to exclude the possibility of cystitis. Electronically Signed   By: Lovey Newcomer M.D.   On: 02/20/2018 21:08   Ct  Abdomen Pelvis W Contrast  Result Date: 02/20/2018 CLINICAL DATA:  Patient with shortness of breath, nausea and vomiting. EXAM: CT ANGIOGRAPHY CHEST CT ABDOMEN AND PELVIS WITH CONTRAST TECHNIQUE: Multidetector CT imaging of the chest was performed using the standard protocol during bolus administration of intravenous contrast. Multiplanar CT image reconstructions and MIPs were obtained to evaluate the vascular anatomy. Multidetector CT imaging of the abdomen and pelvis was performed using the standard protocol during bolus administration of intravenous contrast. CONTRAST:  124mL ISOVUE-370 IOPAMIDOL (ISOVUE-370) INJECTION 76% COMPARISON:  CT abdomen pelvis 02/08/2018 FINDINGS: CTA CHEST FINDINGS Cardiovascular: Heart is enlarged. Small pericardial effusion. Thoracic aortic vascular calcifications. Main pulmonary artery is dilated as can be seen with pulmonary arterial hypertension. Coronary arterial vascular calcifications. Adequate opacification  of the main pulmonary artery. Motion artifact limits evaluation. No abnormal filling defects identified to suggest acute pulmonary embolus. Mediastinum/Nodes: 1.8 cm right paratracheal lymph node (image 47; series 5). 2.2 cm right hilar node (image 56; series 5). Small hiatal hernia. Otherwise normal appearance of the esophagus. Lungs/Pleura: Expiratory phase imaging. There is a large left and small right pleural effusion. Large area of consolidation throughout the left lower lobe. Patchy ground-glass opacities throughout the aerated left upper lobe. Patchy ground-glass opacities throughout the aerated right lung. 4 mm right middle lobe nodule (image 67; series 6). 5 mm right middle lobe nodule (image 75; series 6). Nodules are similar when compared to prior exam, suggestive of benign etiology. Interlobular septal thickening. Fluid along the fissures. No pneumothorax. Musculoskeletal: No aggressive or acute appearing osseous lesions. Stable sclerosis within the posterior  right fifth rib. Review of the MIP images confirms the above findings. CT ABDOMEN and PELVIS FINDINGS Hepatobiliary: Liver is normal in size and contour. Re demonstrated 5.0 x 3.9 cm fluid collection within the gallbladder fossa (image 41; series 12). Interval development of small amount of perihepatic fluid (image 18; series 12). Mild heterogeneous enhancement of the adjacent hepatic parenchyma (image 49; series 12). Fatty deposition adjacent to the falciform ligament. No intrahepatic biliary ductal dilatation. Pancreas: Unremarkable Spleen: Normal in size without focal abnormality. Adrenals/Urinary Tract: Normal adrenal glands. Kidneys enhance symmetrically with contrast. Interval insertion right double-J nephroureteral stent which appears in appropriate position. Marked wall thickening of the urinary bladder. Stomach/Bowel: No abnormal bowel wall thickening or evidence for bowel obstruction. No free fluid or free intraperitoneal air. Vascular/Lymphatic: Normal caliber abdominal aorta. Peripheral calcified atherosclerotic plaque. Prominent subcentimeter retroperitoneal lymph nodes. Reproductive: Heterogeneous prostate. Other: None. Musculoskeletal: No aggressive or acute appearing osseous lesions. Lumbar spine degenerative changes. Review of the MIP images confirms the above findings. IMPRESSION: Chest: 1. Large left and small right pleural effusions. Consolidation within the left lower lobe may represent atelectasis or infection. Patchy ground-glass opacities throughout the aerated lungs bilaterally favored to represent edema. Atypical infectious process not excluded. 2. No evidence for acute pulmonary embolus. 3. Similar-appearing mediastinal adenopathy, potentially reactive in etiology. Abdomen pelvis: 1. There is a residual fluid collection within the gallbladder fossa which is similar in size when compared to recent abdomen pelvic CT. Additionally, there is new perihepatic fluid. Possibility of bile leak not  entirely excluded. Consider further evaluation with HIDA exam as clinically indicated. 2. Interval insertion right-sided double-J nephroureteral stent. No residual hydronephrosis. 3. Marked wall thickening of the urinary bladder. Recommend correlation with urinalysis to exclude the possibility of cystitis. Electronically Signed   By: Lovey Newcomer M.D.   On: 02/20/2018 21:08    EKG: Independently reviewed.  Assessment/Plan Principal Problem:   Acute pulmonary edema (HCC) Active Problems:   HTN (hypertension)   Diabetes type 2, controlled ([place])   ESRD on dialysis ([place])   COPD (chronic obstructive pulmonary disease) (HCC)   Polysubstance abuse ([place])   Post-cholecystectomy syndrome   Acute lower UTI    1. Acute pulm edema - 1. Due to missed dialysis sessions 2. NTG paste for tonight as well as home BP meds 3. Add IV hydralazine if needed PRN 4. Call nephro in AM for dialysis 5. Giving his usual evening dose of torsemide tonight as he does make urine 2. UTI - 1. Dysuria and infected looking UA 2. S/p ureteral stent placement at end of Jan 3. Cefepime 4. UCx pending 3. Post-cholecystectomy syndrome - 1. UTI vs bile leak, ?  Bile leak on CT 2. EDP spoke with Dr. Carlean Purl 1. Get HIDA scan after dialysis tomorrow AM 2. No need to call surgery yet 3. Will make patient NPO after MN for hida scan 4. Abd pain - continue PO oxycodone PRN for the moment 5. ESRD - Dialysis in AM as above, overdue 6. DM2 - will be NPO in AM: 1. Lantus 10 daily 2. Sensitive scale SSI Q4H 7. HTN - continue all home BP meds 8. COPD - not in exacerbation, continue home O2  DVT prophylaxis: Heparin [place] Code Status: Full Family Communication: No family at bedside Disposition Plan: Home after admit Consults called: EDP spoke with Dr. Carlean Purl, Call nephrology in AM Admission status: Place in obs    [place], [place] Hospitalists  How to contact the [facility] Attending or Consulting provider [place] or  covering provider during after hours [place], for this patient?  1. Check the care team in [facility] and look for a) attending/consulting [facility] provider listed and b) the [facility] team listed 2. Log into [email or web address]  Amion Physician Scheduling and messaging for groups and whole hospitals  On call and physician scheduling software for group practices, residents, hospitalists and other medical providers for call, clinic, rotation and shift schedules. On[employer] is a hospital-wide system for scheduling doctors and paging doctors on call. EasyPlot is for scientific plotting and data analysis.  [email or web address]  and use [place]'s universal password to access. If you do not have the password, please contact the hospital operator.  3. Locate the [facility] provider you are looking for under [facility] and page to a number that you can be directly reached. 4. If you still have difficulty reaching the provider, please page the [facility] (Director on Call) for the Hospitalists listed on amion for assistance.  02/20/2018, 10:55 PM

## 2018-02-20 NOTE — ED Provider Notes (Signed)
[facility] EMERGENCY DEPARTMENT Provider Note   CSN: [id number] Arrival date & time: 02/20/18  1800     History   Chief Complaint Chief Complaint  Patient presents with  . Shortness of Breath    HPI Jon Russell is a 58 y.o. male with history of CHF, COPD, CAD, diabetes mellitus, GERD, HLD, HTN, renal insufficiency on dialysis Tuesday Thursday Saturday presents for evaluation of acute onset, progressively worsening shortness of breath for 6 days.  Reports 2 recent admissions to an outside hospital.  He was admitted on 01/30/2018 with complaint of abdominal pain nausea and vomiting found to have evidence of chronic cholecystitis and underwent laparoscopic cholecystectomy on 02/04/2018.  He was then discharged but returned and was admitted for persistent nausea vomiting, chest pain, shortness of breath.  At that time it was noted he had chronic hydronephrosis and a right ureteral stent was placed by urology on 02/09/2018 with plan to remove in 2 to 3 weeks.  He states that for the past 6 days or so he has felt persistent shortness of breath, worse with any exertion or attempt to ambulate.  He is on 3 L via nasal cannula at all times at home.  Also notes intermittent substernal chest pains which he describes as sharp and pressure, radiating to the left side of the chest.  Pain is not exertional, occurs randomly and resolves within 15 minutes.  Has not taken anything for it.  Also notes sharp upper abdominal pain and developed nausea and has had one episode of nonbloody nonbilious emesis today.  Reports he is not had a bowel movement in 1 week and states that he is not passing flatus.  Last dialysis was Tuesday, has missed 2 treatments.  Has been on dialysis for the past 5 years, does report that he has been producing urine after stent placement.  The history is provided by the patient.    Past Medical History:  Diagnosis Date  . Anemia   . Anginal pain ([place])   . Back pain   .  Chronic diastolic CHF (congestive heart failure) ([place])   . COPD (chronic obstructive pulmonary disease) ([place])   . Coronary artery disease   . Depression   . Diabetes mellitus   . Diabetic retinopathy ([place])    Legally blind  . GERD (gastroesophageal reflux disease)   . Hyperlipemia   . Hypertension    dr bensimhon  . Renal insufficiency    not on dialysis; 07/2011: baseline Cr 1.8-2.0  . Shortness of breath   . Sleep apnea    USES CPAP    Patient Active Problem List   Diagnosis Date Noted  . 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 Acute lower UTI 02/20/2018  . Acute pulmonary edema ([place]) 11/11/2016  . Pulmonary edema 11/11/2016  . Polysubstance abuse ([place]) 11/11/2016  . Acute on chronic renal failure ([place]) 08/09/2011  . Tobacco abuse 08/09/2011  . Acute on chronic respiratory failure with hypoxia ([place]) 08/09/2011  . COPD (chronic obstructive pulmonary disease) ([place]) 08/09/2011  . GERD (gastroesophageal reflux disease) 08/09/2011  . ESRD on dialysis ([place]) 04/23/2011  . OSA (obstructive sleep apnea) 03/09/2011  . Polypharmacy 12/29/2010  . Acute on chronic diastolic CHF (congestive heart failure) ([place]) 12/22/2010  . Diastolic CHF, acute on chronic ([place]) 12/16/2010  . Hypoglycemia 12/13/2010  . HTN (hypertension) 12/12/2010  . Diabetes type 2, controlled ([place]) 12/12/2010  . Anemia associated with chronic renal failure 12/12/2010  . Hypercholesterolemia 12/12/2010  . Alcohol dependence (  [place]) 12/12/2010    Past Surgical History:  Procedure Laterality Date  . AV FISTULA PLACEMENT  05/05/2011   Procedure: ARTERIOVENOUS (AV) FISTULA CREATION;  Surgeon: Elam Dutch, MD;  Location: [facility];  Service: Vascular;  Laterality: Left;  Left Radial Cephalic Arteriovenous Fistula Creation  . EYE SURGERY     legally blind both eyes  . LUNG BIOPSY          Home Medications    Prior to Admission medications   Medication Sig Start Date End Date Taking? Authorizing Provider    acetaminophen (TYLENOL) 500 MG tablet Take 1,000 mg by mouth every 6 (six) hours as needed for mild pain.    Yes [provider]  albuterol (PROVENTIL HFA;VENTOLIN HFA) 108 (90 Base) MCG/ACT inhaler Inhale 2 puffs into the lungs every 6 (six) hours as needed for wheezing or shortness of breath.   Yes [provider]  aspirin 81 MG EC tablet Take 81 mg by mouth at bedtime.    Yes [provider]  calcium carbonate (TUMS EX) 750 MG chewable tablet Chew 2-3 tablets by mouth See admin instructions. Take 3 tablets by mouth with meals and 2 tablets with snacks   Yes [provider]  dorzolamide-timolol (COSOPT) 22.3-6.8 MG/ML ophthalmic solution Place 1 drop into both eyes 2 (two) times daily.    Yes [provider]  DULoxetine (CYMBALTA) 60 MG capsule Take 60 mg by mouth 2 (two) times daily.    Yes [provider]  insulin aspart (NOVOLOG FLEXPEN) 100 UNIT/ML FlexPen Inject 4-12 Units into the skin 3 (three) times daily as needed for high blood sugar (CBG 120). Per sliding scale based on CBG   Yes [provider]  Insulin Detemir (LEVEMIR FLEXTOUCH) 100 UNIT/ML Pen Inject 20 Units into the skin daily before breakfast.   Yes [provider]  isosorbide mononitrate (IMDUR) 120 MG 24 hr tablet Take 120 mg by mouth daily.   Yes [provider]  labetalol (NORMODYNE) 300 MG tablet Take 300 mg by mouth 2 (two) times daily.   Yes [provider]  NIFEdipine (PROCARDIA XL/ADALAT-CC) 60 MG 24 hr tablet Take 60 mg by mouth daily.  12/29/10  Yes Bensimhon, Shaune Pascal, MD  oxyCODONE (ROXICODONE) 15 MG immediate release tablet Take 15 mg by mouth every 6 (six) hours as needed for pain.   Yes [provider]  OXYGEN Inhale 3 L into the lungs continuous.   Yes [provider]  pantoprazole (PROTONIX) 40 MG tablet Take 40 mg by mouth daily.    Yes Bensimhon, Shaune Pascal, MD  torsemide (DEMADEX) 20 MG tablet Take 20 mg  by mouth 2 (two) times daily.    Yes Wynetta Emery, PA-C  traZODone (DESYREL) 50 MG tablet Take 50-100 mg by mouth at bedtime.    Yes [provider]    Family History Family History  Problem Relation Age of Onset  . Diabetes Mother   . Heart disease Mother   . Hyperlipidemia Mother   . Hypertension Mother   . Diabetes Father   . Heart disease Father   . Hyperlipidemia Father   . Hypertension Father   . Diabetes Sister   . Hyperlipidemia Sister   . Hypertension Sister   . Diabetes Brother   . Heart disease Brother   . Hyperlipidemia Brother   . Hypertension Brother   . Heart attack Brother   . Other Brother        Amputation  Social History Social History   Tobacco Use  . Smoking status: Current Every Day Smoker    Packs/day: 1.00    Years: 30.00    Pack years: 30.00    Types: Cigarettes  . Smokeless tobacco: Never Used  Substance Use Topics  . Alcohol use: Yes    Comment: states he drinks 1/5 of liquor dailt- last drink 30 days.   no etoh x 78mos       ys ago  . Drug use: Yes    Types: Cocaine    Comment: states last used 30 days ago 2012     Allergies   Statins   Review of Systems Review of Systems  Constitutional: Negative for chills and fever.  Respiratory: Positive for shortness of breath.   Cardiovascular: Positive for chest pain.  Gastrointestinal: Positive for abdominal pain, constipation, nausea and vomiting.  Genitourinary: Negative for dysuria and frequency.  All other systems reviewed and are negative.    Physical Exam Updated Vital Signs BP (!) 190/73   Pulse 69   Temp 98.1 F (36.7 C) (Oral)   Resp 20   Ht 5\' 7"  (1.702 m)   Wt 109.3 kg   SpO2 91%   BMI 37.75 kg/m   Physical Exam Vitals signs and nursing note reviewed.  Constitutional:      General: He is not in acute distress.    Appearance: He is well-developed.  HENT:     Head: Normocephalic and atraumatic.  Eyes:     General:        Right eye: No  discharge.        Left eye: No discharge.     Conjunctiva/sclera: Conjunctivae normal.  Neck:     Vascular: No JVD.     Trachea: No tracheal deviation.  Cardiovascular:     Rate and Rhythm: Normal rate and regular rhythm.     Pulses: Normal pulses.     Comments: AV fistula in the left upper extremity with palpable thrill.  No lower extremity edema, Homans sign absent bilaterally.  Compartments are soft.  2+ radial and DP/PT pulses bilaterally Pulmonary:     Effort: Tachypnea present.     Breath sounds: Decreased breath sounds present.     Comments: Globally diminished breath sounds, SPO2 saturations 97% on 3 L via nasal cannula at rest Abdominal:     General: Abdomen is protuberant. A surgical scar is present. Bowel sounds are decreased. There is no distension.     Palpations: Abdomen is soft.     Tenderness: There is abdominal tenderness in the right upper quadrant, epigastric area and left upper quadrant. There is no right CVA tenderness, left CVA tenderness, guarding or rebound. Negative signs include Murphy's sign.     Comments: Well-healed laparoscopic surgical incisions with no erythema, induration, or drainage.  Maximally tender to palpation in the epigastric region.  Skin:    Findings: No erythema.  Neurological:     Mental Status: He is alert.  Psychiatric:        Behavior: Behavior normal.      ED Treatments / Results  Labs (all labs ordered are listed, but only abnormal results are displayed) Labs Reviewed  BASIC METABOLIC PANEL - Abnormal; Notable for the following components:      Result Value   CO2 18 (*)    Glucose, Bld 184 (*)    BUN 63 (*)    Creatinine, Ser 10.47 (*)    Calcium 8.5 (*)    GFR  calc non Af Amer 5 (*)    GFR calc Af Amer 6 (*)    All other components within normal limits  CBC - Abnormal; Notable for the following components:   RBC 3.33 (*)    Hemoglobin 9.7 (*)    HCT 33.2 (*)    MCHC 29.2 (*)    All other components within normal limits   HEPATIC FUNCTION PANEL - Abnormal; Notable for the following components:   Albumin 2.9 (*)    AST 14 (*)    All other components within normal limits  LIPASE, BLOOD - Abnormal; Notable for the following components:   Lipase 112 (*)    All other components within normal limits  URINALYSIS, ROUTINE W REFLEX MICROSCOPIC - Abnormal; Notable for the following components:   APPearance CLOUDY (*)    Hgb urine dipstick MODERATE (*)    Protein, ur 100 (*)    Leukocytes, UA LARGE (*)    WBC, UA >50 (*)    Bacteria, UA RARE (*)    All other components within normal limits  URINE CULTURE  URINE CULTURE  TROPONIN I  TROPONIN I  TROPONIN I  HIV ANTIBODY (ROUTINE TESTING W REFLEX)  I-STAT TROPONIN, ED    EKG EKG Interpretation  Date/Time:  Sunday February 20 2018 18:07:17 EST Ventricular Rate:  68 PR Interval:  192 QRS Duration: 108 QT Interval:  444 QTC Calculation: 472 R Axis:   -14 Text Interpretation:  Normal sinus rhythm T wave abnormality , new since 2018 Abnormal ekg Confirmed by Carmin Muskrat [phone]) on 02/20/2018 6:49:51 PM   Radiology Dg Chest 2 View  Result Date: 02/20/2018 CLINICAL DATA:  Shortness of breath for 2-3 days, history CHF, has not been taking Lasix, smoker, COPD, diabetes mellitus, hypertension, hyperlipidemia, GERD EXAM: CHEST - 2 VIEW COMPARISON:  01/30/2018 FINDINGS: Enlargement of cardiac silhouette with pulmonary vascular congestion. Mild pulmonary edema. Bibasilar pleural effusions and atelectasis greater on LEFT. No pneumothorax. LEFT subclavian/axillary stent. Osseous structures unremarkable. Nephrostomy tube at upper abdomen on lateral view, not visualized on PA view. IMPRESSION: Enlargement of cardiac silhouette with pulmonary vascular congestion, pulmonary edema, and bibasilar pleural effusions/atelectasis LEFT greater than RIGHT. Electronically Signed   By: Lavonia Dana M.D.   On: 02/20/2018 19:20   Ct Angio Chest Pe W And/or Wo Contrast  Result Date:  02/20/2018 CLINICAL DATA:  Patient with shortness of breath, nausea and vomiting. EXAM: CT ANGIOGRAPHY CHEST CT ABDOMEN AND PELVIS WITH CONTRAST TECHNIQUE: Multidetector CT imaging of the chest was performed using the standard protocol during bolus administration of intravenous contrast. Multiplanar CT image reconstructions and MIPs were obtained to evaluate the vascular anatomy. Multidetector CT imaging of the abdomen and pelvis was performed using the standard protocol during bolus administration of intravenous contrast. CONTRAST:  190mL ISOVUE-370 IOPAMIDOL (ISOVUE-370) INJECTION 76% COMPARISON:  CT abdomen pelvis 02/08/2018 FINDINGS: CTA CHEST FINDINGS Cardiovascular: Heart is enlarged. Small pericardial effusion. Thoracic aortic vascular calcifications. Main pulmonary artery is dilated as can be seen with pulmonary arterial hypertension. Coronary arterial vascular calcifications. Adequate opacification of the main pulmonary artery. Motion artifact limits evaluation. No abnormal filling defects identified to suggest acute pulmonary embolus. Mediastinum/Nodes: 1.8 cm right paratracheal lymph node (image 47; series 5). 2.2 cm right hilar node (image 56; series 5). Small hiatal hernia. Otherwise normal appearance of the esophagus. Lungs/Pleura: Expiratory phase imaging. There is a large left and small right pleural effusion. Large area of consolidation throughout the left lower lobe. Patchy ground-glass opacities throughout the  aerated left upper lobe. Patchy ground-glass opacities throughout the aerated right lung. 4 mm right middle lobe nodule (image 67; series 6). 5 mm right middle lobe nodule (image 75; series 6). Nodules are similar when compared to prior exam, suggestive of benign etiology. Interlobular septal thickening. Fluid along the fissures. No pneumothorax. Musculoskeletal: No aggressive or acute appearing osseous lesions. Stable sclerosis within the posterior right fifth rib. Review of the MIP images  confirms the above findings. CT ABDOMEN and PELVIS FINDINGS Hepatobiliary: Liver is normal in size and contour. Re demonstrated 5.0 x 3.9 cm fluid collection within the gallbladder fossa (image 41; series 12). Interval development of small amount of perihepatic fluid (image 18; series 12). Mild heterogeneous enhancement of the adjacent hepatic parenchyma (image 49; series 12). Fatty deposition adjacent to the falciform ligament. No intrahepatic biliary ductal dilatation. Pancreas: Unremarkable Spleen: Normal in size without focal abnormality. Adrenals/Urinary Tract: Normal adrenal glands. Kidneys enhance symmetrically with contrast. Interval insertion right double-J nephroureteral stent which appears in appropriate position. Marked wall thickening of the urinary bladder. Stomach/Bowel: No abnormal bowel wall thickening or evidence for bowel obstruction. No free fluid or free intraperitoneal air. Vascular/Lymphatic: Normal caliber abdominal aorta. Peripheral calcified atherosclerotic plaque. Prominent subcentimeter retroperitoneal lymph nodes. Reproductive: Heterogeneous prostate. Other: None. Musculoskeletal: No aggressive or acute appearing osseous lesions. Lumbar spine degenerative changes. Review of the MIP images confirms the above findings. IMPRESSION: Chest: 1. Large left and small right pleural effusions. Consolidation within the left lower lobe may represent atelectasis or infection. Patchy ground-glass opacities throughout the aerated lungs bilaterally favored to represent edema. Atypical infectious process not excluded. 2. No evidence for acute pulmonary embolus. 3. Similar-appearing mediastinal adenopathy, potentially reactive in etiology. Abdomen pelvis: 1. There is a residual fluid collection within the gallbladder fossa which is similar in size when compared to recent abdomen pelvic CT. Additionally, there is new perihepatic fluid. Possibility of bile leak not entirely excluded. Consider further  evaluation with HIDA exam as clinically indicated. 2. Interval insertion right-sided double-J nephroureteral stent. No residual hydronephrosis. 3. Marked wall thickening of the urinary bladder. Recommend correlation with urinalysis to exclude the possibility of cystitis. Electronically Signed   By: Lovey Newcomer M.D.   On: 02/20/2018 21:08   Ct Abdomen Pelvis W Contrast  Result Date: 02/20/2018 CLINICAL DATA:  Patient with shortness of breath, nausea and vomiting. EXAM: CT ANGIOGRAPHY CHEST CT ABDOMEN AND PELVIS WITH CONTRAST TECHNIQUE: Multidetector CT imaging of the chest was performed using the standard protocol during bolus administration of intravenous contrast. Multiplanar CT image reconstructions and MIPs were obtained to evaluate the vascular anatomy. Multidetector CT imaging of the abdomen and pelvis was performed using the standard protocol during bolus administration of intravenous contrast. CONTRAST:  153mL ISOVUE-370 IOPAMIDOL (ISOVUE-370) INJECTION 76% COMPARISON:  CT abdomen pelvis 02/08/2018 FINDINGS: CTA CHEST FINDINGS Cardiovascular: Heart is enlarged. Small pericardial effusion. Thoracic aortic vascular calcifications. Main pulmonary artery is dilated as can be seen with pulmonary arterial hypertension. Coronary arterial vascular calcifications. Adequate opacification of the main pulmonary artery. Motion artifact limits evaluation. No abnormal filling defects identified to suggest acute pulmonary embolus. Mediastinum/Nodes: 1.8 cm right paratracheal lymph node (image 47; series 5). 2.2 cm right hilar node (image 56; series 5). Small hiatal hernia. Otherwise normal appearance of the esophagus. Lungs/Pleura: Expiratory phase imaging. There is a large left and small right pleural effusion. Large area of consolidation throughout the left lower lobe. Patchy ground-glass opacities throughout the aerated left upper lobe. Patchy ground-glass opacities throughout the aerated  right lung. 4 mm right middle  lobe nodule (image 67; series 6). 5 mm right middle lobe nodule (image 75; series 6). Nodules are similar when compared to prior exam, suggestive of benign etiology. Interlobular septal thickening. Fluid along the fissures. No pneumothorax. Musculoskeletal: No aggressive or acute appearing osseous lesions. Stable sclerosis within the posterior right fifth rib. Review of the MIP images confirms the above findings. CT ABDOMEN and PELVIS FINDINGS Hepatobiliary: Liver is normal in size and contour. Re demonstrated 5.0 x 3.9 cm fluid collection within the gallbladder fossa (image 41; series 12). Interval development of small amount of perihepatic fluid (image 18; series 12). Mild heterogeneous enhancement of the adjacent hepatic parenchyma (image 49; series 12). Fatty deposition adjacent to the falciform ligament. No intrahepatic biliary ductal dilatation. Pancreas: Unremarkable Spleen: Normal in size without focal abnormality. Adrenals/Urinary Tract: Normal adrenal glands. Kidneys enhance symmetrically with contrast. Interval insertion right double-J nephroureteral stent which appears in appropriate position. Marked wall thickening of the urinary bladder. Stomach/Bowel: No abnormal bowel wall thickening or evidence for bowel obstruction. No free fluid or free intraperitoneal air. Vascular/Lymphatic: Normal caliber abdominal aorta. Peripheral calcified atherosclerotic plaque. Prominent subcentimeter retroperitoneal lymph nodes. Reproductive: Heterogeneous prostate. Other: None. Musculoskeletal: No aggressive or acute appearing osseous lesions. Lumbar spine degenerative changes. Review of the MIP images confirms the above findings. IMPRESSION: Chest: 1. Large left and small right pleural effusions. Consolidation within the left lower lobe may represent atelectasis or infection. Patchy ground-glass opacities throughout the aerated lungs bilaterally favored to represent edema. Atypical infectious process not excluded. 2. No  evidence for acute pulmonary embolus. 3. Similar-appearing mediastinal adenopathy, potentially reactive in etiology. Abdomen pelvis: 1. There is a residual fluid collection within the gallbladder fossa which is similar in size when compared to recent abdomen pelvic CT. Additionally, there is new perihepatic fluid. Possibility of bile leak not entirely excluded. Consider further evaluation with HIDA exam as clinically indicated. 2. Interval insertion right-sided double-J nephroureteral stent. No residual hydronephrosis. 3. Marked wall thickening of the urinary bladder. Recommend correlation with urinalysis to exclude the possibility of cystitis. Electronically Signed   By: Lovey Newcomer M.D.   On: 02/20/2018 21:08    Procedures Procedures (including critical care time)  Medications Ordered in ED Medications  iopamidol (ISOVUE-370) 76 % injection (has no administration in time range)  DULoxetine (CYMBALTA) DR capsule 60 mg (has no administration in time range)  albuterol (PROVENTIL) (2.5 MG/3ML) 0.083% nebulizer solution 3 mL (has no administration in time range)  aspirin EC tablet 81 mg (has no administration in time range)  calcium carbonate (TUMS EX) chewable tablet 1,500-2,250 mg (has no administration in time range)  dorzolamide-timolol (COSOPT) 22.3-6.8 MG/ML ophthalmic solution 1 drop (has no administration in time range)  NIFEdipine (PROCARDIA XL/NIFEDICAL XL) 24 hr tablet 60 mg (has no administration in time range)  labetalol (NORMODYNE) tablet 300 mg (has no administration in time range)  isosorbide mononitrate (IMDUR) 24 hr tablet 120 mg (has no administration in time range)  torsemide (DEMADEX) tablet 20 mg (has no administration in time range)  traZODone (DESYREL) tablet 50-100 mg (has no administration in time range)  pantoprazole (PROTONIX) EC tablet 40 mg (has no administration in time range)  nitroGLYCERIN (NITROGLYN) 2 % ointment 1 inch (1 inch Topical Given 02/20/18 2323)  oxyCODONE  (Oxy IR/ROXICODONE) immediate release tablet 15 mg (has no administration in time range)  acetaminophen (TYLENOL) tablet 650 mg (has no administration in time range)    Or  acetaminophen (TYLENOL) suppository  650 mg (has no administration in time range)  ondansetron (ZOFRAN) tablet 4 mg (has no administration in time range)    Or  ondansetron (ZOFRAN) injection 4 mg (has no administration in time range)  heparin injection 5,000 Units (has no administration in time range)  insulin glargine (LANTUS) injection 10 Units (has no administration in time range)  insulin aspart (novoLOG) injection 0-9 Units (has no administration in time range)  ceFEPIme (MAXIPIME) 2 g in sodium chloride 0.9 % 100 mL IVPB (2 g Intravenous New Bag/Given 02/20/18 2330)  ceFEPIme (MAXIPIME) 2 g in sodium chloride 0.9 % 100 mL IVPB (has no administration in time range)  sodium chloride flush (NS) 0.9 % injection 3 mL (3 mLs Intravenous Given 02/20/18 2321)  iopamidol (ISOVUE-370) 76 % injection 100 mL (100 mLs Intravenous Contrast Given 02/20/18 2014)  morphine 4 MG/ML injection 4 mg (4 mg Intravenous Given 02/20/18 2100)  metoCLOPramide (REGLAN) injection 10 mg (10 mg Intravenous Given 02/20/18 2059)     Initial Impression / Assessment and Plan / ED Course  I have reviewed the triage vital signs and the nursing notes.  Pertinent labs & imaging results that were available during my care of the patient were reviewed by me and considered in my medical decision making (see chart for details).     Patient presenting for evaluation of shortness of breath, chest pain, abdominal pain, and vomiting.  He is afebrile, persistently hypertensive in the ED.  Stable on his home 3 L via nasal cannula while in bed.  Has not had dialysis since Tuesday.  He is 16 days status post laparoscopic cholecystectomy 11 days status post right ureteral stent placement.  Will obtain imaging to rule out PE and acute intra-abdominal abnormality.  Lab work  reviewed by me shows elevated BUN and creatinine however her electrolytes are stable.  He is anemic.  LFTs are not elevated however lipase is somewhat elevated.  Unsure of clinical significance as he has no evidence of pancreatitis on CT.  Imaging shows no evidence of PE but there are large left and small right pleural effusions with possible pneumonia.  Also shows a residual fluid collection within the gallbladder fossa which was seen on recent imaging as well as new perihepatic fluid suggesting possible bile leak.  His hydronephrosis has resolved but he does have wall thickening of the urinary bladder suggesting UTI and his UA corroborates this.  Does not appear to be septic at this time.  Spoke with Dr. Arelia Longest with GI who recommends HIDA scan in the morning after dialysis.  He does not feel that emergent surgical consultation is required at this time given patient is stable and a HIDA will be needed for further evaluation of the possible bile leak. Spoke with Dr. Alcario Drought with [facility] who agrees to assume care of patient and bring him into the hospital for further evaluation and management.  Final Clinical Impressions(s) / ED Diagnoses   Final diagnoses:  Other postoperative complication involving digestive system  Pleural effusion  Acute cystitis with hematuria    ED Discharge Orders    None       Debroah Baller 02/20/18 2347    Carmin Muskrat, MD 02/21/18 0000

## 2018-02-20 NOTE — ED Triage Notes (Signed)
Pt states that he also left the hospital on Monday and has not taken his torsemide until today.

## 2018-02-20 NOTE — Progress Notes (Signed)
Pharmacy Antibiotic Note  Jon Russell is a 58 y.o. male admitted on 02/20/2018 with UTI.  Pharmacy has been consulted for cefepime dosing.  Noted pt had right ureteral stent placed by urology on 02/09/2018 with plan to remove in 2 to 3 weeks.  Plan: Cefepime 2g now and after each HD.  Height: 5\' 7"  (170.2 cm) Weight: 241 lb (109.3 kg) IBW/kg (Calculated) : 66.1  Temp (24hrs), Avg:98.1 F (36.7 C), Min:98.1 F (36.7 C), Max:98.1 F (36.7 C)  Recent Labs  Lab 02/20/18 1851  WBC 10.2  CREATININE 10.47*    Estimated Creatinine Clearance: 9.3 mL/min (A) (by C-G formula based on SCr of 10.47 mg/dL (H)).    Allergies  Allergen Reactions  . Statins Other (See Comments)    Leg weakness     Thank you for allowing pharmacy to be a part of this patient's care.  Wynona Neat, PharmD, BCPS  02/20/2018 11:07 PM

## 2018-02-20 NOTE — ED Triage Notes (Signed)
Pt reports shortness of breath x 1 week, and worsening of neck pain. 3L  at home at baseline, pt also reporting nausea and vomiting that started today.

## 2018-02-20 NOTE — ED Notes (Signed)
Missed IV x2.

## 2018-02-20 NOTE — ED Notes (Signed)
Pt ambulated to bathroom 

## 2018-02-21 ENCOUNTER — Encounter (HOSPITAL_COMMUNITY): Payer: Self-pay | Admitting: Internal Medicine

## 2018-02-21 ENCOUNTER — Observation Stay (HOSPITAL_COMMUNITY): Payer: Medicare PPO

## 2018-02-21 DIAGNOSIS — N186 End stage renal disease: Secondary | ICD-10-CM

## 2018-02-21 DIAGNOSIS — R935 Abnormal findings on diagnostic imaging of other abdominal regions, including retroperitoneum: Secondary | ICD-10-CM

## 2018-02-21 DIAGNOSIS — Z992 Dependence on renal dialysis: Secondary | ICD-10-CM

## 2018-02-21 DIAGNOSIS — N3001 Acute cystitis with hematuria: Secondary | ICD-10-CM

## 2018-02-21 DIAGNOSIS — N390 Urinary tract infection, site not specified: Secondary | ICD-10-CM | POA: Diagnosis not present

## 2018-02-21 DIAGNOSIS — J810 Acute pulmonary edema: Secondary | ICD-10-CM | POA: Diagnosis not present

## 2018-02-21 LAB — CBG MONITORING, ED
Glucose-Capillary: 108 mg/dL — ABNORMAL HIGH (ref 70–99)
Glucose-Capillary: 88 mg/dL (ref 70–99)
Glucose-Capillary: 85 mg/dL (ref 70–99)

## 2018-02-21 LAB — MRSA PCR SCREENING: MRSA by PCR: POSITIVE — AB

## 2018-02-21 LAB — IRON AND TIBC
Iron: 40 ug/dL — ABNORMAL LOW (ref 45–182)
Saturation Ratios: 19 % (ref 17.9–39.5)
TIBC: 209 ug/dL — ABNORMAL LOW (ref 250–450)
UIBC: 169 ug/dL

## 2018-02-21 LAB — HIV ANTIBODY (ROUTINE TESTING W REFLEX): HIV Screen 4th Generation wRfx: NONREACTIVE

## 2018-02-21 LAB — GLUCOSE, CAPILLARY
Glucose-Capillary: 86 mg/dL (ref 70–99)
Glucose-Capillary: 95 mg/dL (ref 70–99)
Glucose-Capillary: 167 mg/dL — ABNORMAL HIGH (ref 70–99)

## 2018-02-21 LAB — FERRITIN: Ferritin: 667 ng/mL — ABNORMAL HIGH (ref 24–336)

## 2018-02-21 LAB — TROPONIN I
Troponin I: 0.04 ng/mL (ref <0.03)
Troponin I: 0.03 ng/mL (ref <0.03)

## 2018-02-21 MED ORDER — CHLORHEXIDINE GLUCONATE CLOTH 2 % EX PADS
6.0000 | MEDICATED_PAD | Freq: Every day | CUTANEOUS | Status: DC
  Administered 2018-02-22: 6 via TOPICAL

## 2018-02-21 MED ORDER — TECHNETIUM TC 99M MEBROFENIN IV KIT
4.7400 | PACK | Freq: Once | INTRAVENOUS | Status: AC | PRN
  Administered 2018-02-21: 4.74 via INTRAVENOUS

## 2018-02-21 MED ORDER — CALCIUM CARBONATE ANTACID 500 MG PO CHEW: 3.0000 | CHEWABLE_TABLET | ORAL | Status: DC | PRN

## 2018-02-21 MED ORDER — CALCIUM CARBONATE ANTACID 500 MG PO CHEW
800.0000 mg | CHEWABLE_TABLET | Freq: Three times a day (TID) | ORAL | Status: DC
  Administered 2018-02-22 (×3): 800 mg via ORAL
  Filled 2018-02-21 (×4): qty 4

## 2018-02-21 NOTE — Consult Note (Signed)
Consultation  Referring Provider:     [facility] Primary Care Physician:  Verner Chol, MD Primary Gastroenterologist:       Althia Forts  Reason for Consultation:     ? Bile leak     Impression / Plan:   Perihepatic ascites/fluid ? Post-op bile leak s/p [facility] 02/04/2018  Non-compliance with dialysis and pulmonary edema, volume overload  Right hydronephrosis treated with right ureteral stent 02/09/2018  Mildly elevated lipase - likely related to ESRD and not pancreatitis     My index of suspicion of bile leak is low but he needs HIDA scan to check for it. If + for leak will need to consider ERCP and biliary stent pleacement  Dr. Havery Moros is our GI hospitalist this week and will f/u  Gatha Mayer, MD, [facility] 02/21/2018 10:52 AM Pager [phone]          HPI:   Jon Russell is a 58 y.o. male admitted to [facility] 1/20 - had gallstones on [place]  and HIDA with non-visualization of small bowel. ? Chronic cholecystitis. 1/24 lap chole - I cannot see op note - no IOC imaging listed.He was discharged and returned with nausea and vomiting and CP, SOB, found to have right hydronephrosius and right ureteral stent placed. DC home. Since then he has had dysuria. He also c/o epigastric pain - hard to characterize. Says he did not go to hemodialysis because of pain and that pain meds did not help enough. Came here yesterday with increaseing dyspnea. CT scan shows post-op GB fossa fluid and new perihapatic fluid in addition to bilat pleural effusions and pulmonary edema. He is to have dialysis today. He is able to lie flat in the bed. Has not vomited here. Limited historian  Prior GI Hx is colonoscopy "years ago" in [employer]" - results unknown  Past Medical History:  Diagnosis Date  . Anemia   . Anginal pain ([place])   . Back pain   . Chronic diastolic CHF (congestive heart failure) ([place])   . COPD (chronic obstructive pulmonary disease) ([place])   . Coronary  artery disease   . Depression   . Diabetes mellitus   . Diabetic retinopathy ([place])    Legally blind  . ESRD on hemodialysis ([place])   . GERD (gastroesophageal reflux disease)   . Hyperlipemia   . Hypertension    dr bensimhon  . Shortness of breath   . Sleep apnea    USES CPAP    Past Surgical History:  Procedure Laterality Date  . AV FISTULA PLACEMENT  05/05/2011   Procedure: ARTERIOVENOUS (AV) FISTULA CREATION;  Surgeon: Elam Dutch, MD;  Location: [facility];  Service: Vascular;  Laterality: Left;  Left Radial Cephalic Arteriovenous Fistula Creation  . COLONOSCOPY    . EYE SURGERY     legally blind both eyes  . LAPAROSCOPIC CHOLECYSTECTOMY  02/04/2018   stones, ? chronic cholecystitis  . LUNG BIOPSY    . URETERAL STENT PLACEMENT Right 02/09/2018    Family History  Problem Relation Age of Onset  . Diabetes Mother   . Heart disease Mother   . Hyperlipidemia Mother   . Hypertension Mother   . Diabetes Father   . Heart disease Father   . Hyperlipidemia Father   . Hypertension Father   . Diabetes Sister   . Hyperlipidemia Sister   . Hypertension Sister   . Diabetes Brother   . Heart disease Brother   . Hyperlipidemia Brother   .  Hypertension Brother   . Heart attack Brother   . Other Brother        Amputation    Social History   Tobacco Use  . Smoking status: Current Every Day Smoker    Packs/day: 1.00    Years: 30.00    Pack years: 30.00    Types: Cigarettes  . Smokeless tobacco: Never Used  Substance Use Topics  . Alcohol use: Yes    Comment: states he drinks 1/5 of liquor dailt- last drink 30 days.   no etoh x 103mos       ys ago  . Drug use: Yes    Types: Cocaine    Comment: states last used 30 days ago 2012   Social History   Social History Narrative   Married   Disabled Administrator    Prior to Admission medications   Medication Sig Start Date End Date Taking? Authorizing Provider  acetaminophen (TYLENOL) 500 MG tablet Take 1,000 mg by mouth  every 6 (six) hours as needed for mild pain.    Yes [provider]  albuterol (PROVENTIL HFA;VENTOLIN HFA) 108 (90 Base) MCG/ACT inhaler Inhale 2 puffs into the lungs every 6 (six) hours as needed for wheezing or shortness of breath.   Yes [provider]  aspirin 81 MG EC tablet Take 81 mg by mouth at bedtime.    Yes [provider]  calcium carbonate (TUMS EX) 750 MG chewable tablet Chew 2-3 tablets by mouth See admin instructions. Take 3 tablets by mouth with meals and 2 tablets with snacks   Yes [provider]  dorzolamide-timolol (COSOPT) 22.3-6.8 MG/ML ophthalmic solution Place 1 drop into both eyes 2 (two) times daily.    Yes [provider]  DULoxetine (CYMBALTA) 60 MG capsule Take 60 mg by mouth 2 (two) times daily.    Yes [provider]  insulin aspart (NOVOLOG FLEXPEN) 100 UNIT/ML FlexPen Inject 4-12 Units into the skin 3 (three) times daily as needed for high blood sugar (CBG 120). Per sliding scale based on CBG   Yes [provider]  Insulin Detemir (LEVEMIR FLEXTOUCH) 100 UNIT/ML Pen Inject 20 Units into the skin daily before breakfast.   Yes [provider]  isosorbide mononitrate (IMDUR) 120 MG 24 hr tablet Take 120 mg by mouth daily.   Yes [provider]  labetalol (NORMODYNE) 300 MG tablet Take 300 mg by mouth 2 (two) times daily.   Yes [provider]  NIFEdipine (PROCARDIA XL/ADALAT-CC) 60 MG 24 hr tablet Take 60 mg by mouth daily.  12/29/10  Yes Bensimhon, Shaune Pascal, MD  oxyCODONE (ROXICODONE) 15 MG immediate release tablet Take 15 mg by mouth every 6 (six) hours as needed for pain.   Yes [provider]  OXYGEN Inhale 3 L into the lungs continuous.   Yes [provider]  pantoprazole (PROTONIX) 40 MG tablet Take 40 mg by mouth daily.    Yes Bensimhon, Shaune Pascal, MD  torsemide (DEMADEX) 20 MG tablet Take 20 mg by mouth 2 (two) times daily.    Yes Wynetta Emery, PA-C    traZODone (DESYREL) 50 MG tablet Take 50-100 mg by mouth at bedtime.    Yes [provider]    Current Facility-Administered Medications  Medication Dose Route Frequency Provider Last Rate Last Dose  . acetaminophen (TYLENOL) tablet 650 mg  650 mg Oral Q6H PRN Etta Quill, DO       Or  . acetaminophen (TYLENOL) suppository  650 mg  650 mg Rectal Q6H PRN Etta Quill, DO      . albuterol (PROVENTIL) (2.5 MG/3ML) 0.083% nebulizer solution 3 mL  3 mL Inhalation Q6H PRN Etta Quill, DO      . aspirin EC tablet 81 mg  81 mg Oral QHS Jennette Kettle M, DO      . calcium carbonate (TUMS - dosed in mg elemental calcium) chewable tablet 600 mg of elemental calcium  3 tablet Oral PRN Fuller Plan A, MD      . calcium carbonate (TUMS - dosed in mg elemental calcium) chewable tablet 800 mg of elemental calcium  800 mg of elemental calcium Oral TID WC Smith, Rondell A, MD      . Derrill Memo ON 02/22/2018] ceFEPIme (MAXIPIME) 2 g in sodium chloride 0.9 % 100 mL IVPB  2 g Intravenous Q T,Th,Sat-1800 Bryk, Veronda P, RPH      . Chlorhexidine Gluconate Cloth 2 % PADS 6 each  6 each Topical Q0600 Finnigan, Nancy A, DO      . dorzolamide-timolol (COSOPT) 22.3-6.8 MG/ML ophthalmic solution 1 drop  1 drop Both Eyes BID Jennette Kettle M, DO   1 drop at 02/21/18 1028  . DULoxetine (CYMBALTA) DR capsule 60 mg  60 mg Oral BID Etta Quill, DO   60 mg at 02/21/18 1028  . heparin injection 5,000 Units  5,000 Units Subcutaneous Q8H Jennette Kettle M, DO   5,000 Units at 02/21/18 0630  . insulin aspart (novoLOG) injection 0-9 Units  0-9 Units Subcutaneous Q4H Jennette Kettle M, DO      . insulin glargine (LANTUS) injection 10 Units  10 Units Subcutaneous Daily Alcario Drought, Jared M, DO      . isosorbide mononitrate (IMDUR) 24 hr tablet 120 mg  120 mg Oral Daily Jennette Kettle M, DO   120 mg at 02/21/18 1027  . labetalol (NORMODYNE) tablet 300 mg  300 mg Oral BID Jennette Kettle M, DO   300 mg at 02/21/18 1027   . NIFEdipine (PROCARDIA XL/NIFEDICAL XL) 24 hr tablet 60 mg  60 mg Oral Daily Jennette Kettle M, DO      . nitroGLYCERIN (NITROGLYN) 2 % ointment 1 inch  1 inch Topical Q6H Etta Quill, DO   1 inch at 02/21/18 0630  . ondansetron (ZOFRAN) tablet 4 mg  4 mg Oral Q6H PRN Etta Quill, DO       Or  . ondansetron [facility]) injection 4 mg  4 mg Intravenous Q6H PRN Etta Quill, DO      . oxyCODONE (Oxy IR/ROXICODONE) immediate release tablet 15 mg  15 mg Oral Q6H PRN Etta Quill, DO   15 mg at 02/21/18 1027  . pantoprazole (PROTONIX) EC tablet 40 mg  40 mg Oral Daily Jennette Kettle M, DO   40 mg at 02/21/18 1028  . torsemide (DEMADEX) tablet 20 mg  20 mg Oral BID Jennette Kettle M, DO   20 mg at 02/21/18 5956  . traZODone (DESYREL) tablet 50-100 mg  50-100 mg Oral QHS Etta Quill, DO        Allergies as of 02/20/2018 - Review Complete 02/20/2018  Allergen Reaction Noted  . Statins Other (See Comments) 02/26/2015     Review of Systems:    This is positive for those things mentioned in the HPI, also positive for weakness and fatigue. No feveres All other review of systems are negative.       Physical Exam:  Vital signs in last 24 hours: Temp:  [97.7 F (36.5 C)-98.1 F (36.7 C)] 97.7 F (36.5 C) (02/10 0914) Pulse Rate:  [59-72] 62 (02/10 0914) Resp:  [16-30] 18 (02/10 0914) BP: (170-197)/(72-88) 192/83 (02/10 0914) SpO2:  [83 %-99 %] 98 % (02/10 0914) Weight:  [109.3 kg-115.9 kg] 115.9 kg (02/10 0914)    General:  Well-developed, well-nourished and in no acute distress Eyes:  anicteric. ENT:   Mouth and posterior pharynx free of lesions. Edentuolous Lungs: Bibasilar rales. Heart:   S1S2, no rubs, murmurs, gallops. Abdomen:  obese, soft,  Mildly tender epigastrium, no hepatosplenomegaly, hernia, or mass and BS+.  Lymph:  no cervical or supraclavicular adenopathy. Extremities:   1 + bilateral LE edema, no clubbing Psych:  appropriate mood and   Affect.   Data Reviewed:   LAB RESULTS: Recent Labs    02/20/18 1851  WBC 10.2  HGB 9.7*  HCT 33.2*  PLT 390   BMET Recent Labs    02/20/18 1851  NA 140  K 3.9  CL 107  CO2 18*  GLUCOSE 184*  BUN 63*  CREATININE 10.47*  CALCIUM 8.5*   Lab Results  Component Value Date   LIPASE 112 (H) 02/20/2018    LFT Recent Labs    02/20/18 1851  PROT 7.1  ALBUMIN 2.9*  AST 14*  ALT 14  ALKPHOS 86  BILITOT 0.5  BILIDIR <0.1  IBILI NOT CALCULATED   PT/INR No results for input(s): LABPROT, INR in the last 72 hours.  STUDIES: Dg Chest 2 View  Result Date: 02/20/2018 CLINICAL DATA:  Shortness of breath for 2-3 days, history CHF, has not been taking Lasix, smoker, COPD, diabetes mellitus, hypertension, hyperlipidemia, GERD EXAM: CHEST - 2 VIEW COMPARISON:  01/30/2018 FINDINGS: Enlargement of cardiac silhouette with pulmonary vascular congestion. Mild pulmonary edema. Bibasilar pleural effusions and atelectasis greater on LEFT. No pneumothorax. LEFT subclavian/axillary stent. Osseous structures unremarkable. Nephrostomy tube at upper abdomen on lateral view, not visualized on PA view. IMPRESSION: Enlargement of cardiac silhouette with pulmonary vascular congestion, pulmonary edema, and bibasilar pleural effusions/atelectasis LEFT greater than RIGHT. Electronically Signed   By: Lavonia Dana M.D.   On: 02/20/2018 19:20   Ct Angio Chest Pe W And/or Wo Contrast  Result Date: 02/20/2018 CLINICAL DATA:  Patient with shortness of breath, nausea and vomiting. EXAM: CT ANGIOGRAPHY CHEST CT ABDOMEN AND PELVIS WITH CONTRAST TECHNIQUE: Multidetector CT imaging of the chest was performed using the standard protocol during bolus administration of intravenous contrast. Multiplanar CT image reconstructions and MIPs were obtained to evaluate the vascular anatomy. Multidetector CT imaging of the abdomen and pelvis was performed using the standard protocol during bolus administration of intravenous  contrast. CONTRAST:  190mL ISOVUE-370 IOPAMIDOL (ISOVUE-370) INJECTION 76% COMPARISON:  CT abdomen pelvis 02/08/2018 FINDINGS: CTA CHEST FINDINGS Cardiovascular: Heart is enlarged. Small pericardial effusion. Thoracic aortic vascular calcifications. Main pulmonary artery is dilated as can be seen with pulmonary arterial hypertension. Coronary arterial vascular calcifications. Adequate opacification of the main pulmonary artery. Motion artifact limits evaluation. No abnormal filling defects identified to suggest acute pulmonary embolus. Mediastinum/Nodes: 1.8 cm right paratracheal lymph node (image 47; series 5). 2.2 cm right hilar node (image 56; series 5). Small hiatal hernia. Otherwise normal appearance of the esophagus. Lungs/Pleura: Expiratory phase imaging. There is a large left and small right pleural effusion. Large area of consolidation throughout the left lower lobe. Patchy ground-glass opacities throughout the aerated left upper lobe. Patchy ground-glass opacities throughout the aerated right lung. 4  mm right middle lobe nodule (image 67; series 6). 5 mm right middle lobe nodule (image 75; series 6). Nodules are similar when compared to prior exam, suggestive of benign etiology. Interlobular septal thickening. Fluid along the fissures. No pneumothorax. Musculoskeletal: No aggressive or acute appearing osseous lesions. Stable sclerosis within the posterior right fifth rib. Review of the MIP images confirms the above findings. CT ABDOMEN and PELVIS FINDINGS Hepatobiliary: Liver is normal in size and contour. Re demonstrated 5.0 x 3.9 cm fluid collection within the gallbladder fossa (image 41; series 12). Interval development of small amount of perihepatic fluid (image 18; series 12). Mild heterogeneous enhancement of the adjacent hepatic parenchyma (image 49; series 12). Fatty deposition adjacent to the falciform ligament. No intrahepatic biliary ductal dilatation. Pancreas: Unremarkable Spleen: Normal in  size without focal abnormality. Adrenals/Urinary Tract: Normal adrenal glands. Kidneys enhance symmetrically with contrast. Interval insertion right double-J nephroureteral stent which appears in appropriate position. Marked wall thickening of the urinary bladder. Stomach/Bowel: No abnormal bowel wall thickening or evidence for bowel obstruction. No free fluid or free intraperitoneal air. Vascular/Lymphatic: Normal caliber abdominal aorta. Peripheral calcified atherosclerotic plaque. Prominent subcentimeter retroperitoneal lymph nodes. Reproductive: Heterogeneous prostate. Other: None. Musculoskeletal: No aggressive or acute appearing osseous lesions. Lumbar spine degenerative changes. Review of the MIP images confirms the above findings. IMPRESSION: Chest: 1. Large left and small right pleural effusions. Consolidation within the left lower lobe may represent atelectasis or infection. Patchy ground-glass opacities throughout the aerated lungs bilaterally favored to represent edema. Atypical infectious process not excluded. 2. No evidence for acute pulmonary embolus. 3. Similar-appearing mediastinal adenopathy, potentially reactive in etiology. Abdomen pelvis: 1. There is a residual fluid collection within the gallbladder fossa which is similar in size when compared to recent abdomen pelvic CT. Additionally, there is new perihepatic fluid. Possibility of bile leak not entirely excluded. Consider further evaluation with HIDA exam as clinically indicated. 2. Interval insertion right-sided double-J nephroureteral stent. No residual hydronephrosis. 3. Marked wall thickening of the urinary bladder. Recommend correlation with urinalysis to exclude the possibility of cystitis. Electronically Signed   By: Lovey Newcomer M.D.   On: 02/20/2018 21:08    Thanks   LOS: 0 days   @Judah Chevere  Simonne Maffucci, MD, [facility] @  02/21/2018, 10:52 AM

## 2018-02-21 NOTE — ED Notes (Signed)
Attempted report 

## 2018-02-21 NOTE — Progress Notes (Signed)
Pt just returned from HIDA scan.

## 2018-02-21 NOTE — ED Notes (Signed)
Pt placed on hospital bed for comfort.

## 2018-02-21 NOTE — Progress Notes (Signed)
Pt in dialysis

## 2018-02-21 NOTE — Progress Notes (Signed)
[facility] PROGRESS NOTE  Jon Russell NOM:[id number] DOB: 06/13/1960 DOA: 02/20/2018 PCP: Verner Chol, MD  Assessment/Plan:  1. Acute pulm edema - 1. Chest xray with pulmonary vascular congestion, pulmonary edema and bibasular plerual effusions. Likely related to missed dialysis sessions  2. Potassium 3.9 and creatinine 10.4.  3. Nephrology for consult 2. UTI - 1. Dysuria. Urinalysis with greater than 50 WBC and large leuks. S/p ureteral stent placement at end of Jan 2. Cefepime day #2 3. UCx pending 3. Post-cholecystectomy syndrome - 1. UTI vs bile leak, ? Bile leak on CT 2. EDP spoke with Dr. Carlean Purl 1. Get HIDA scan after dialysis tomorrow AM 2. No need to call surgery yet 3. Will make patient NPO after MN for hida scan 4. Abd pain -  Improved this am. continue PO oxycodone PRN for the moment 5. ESRD - spoke with Dr Grayland Ormond with nephrology. See #1 6. DM2 - CBG's running low this am.  1. Obtain HgA1c. Continue Lantus 10 daily 2. Sensitive scale SSI Q4H 7. HTN -  Poor control. Home meds include continue all home include imdur, labetalol and demadex. continueing home med 8. COPD - not in exacerbation, continue home O2 9. Tobacco use: cessation counseling offered 10. Anemia: hg 9.2. related to CKD. Close to baseline. No s/sx active bleeding Code Status: full Family Communication: none present Disposition Plan: home when ready   Consultants:  [facility]  Procedures:  dialysis  Antibiotics:  Rocephin 02/20/18>>  HPI/Subjective: Sitting up in bed watching TV. No acute distress  recent lap chole (1/24). Discharged admitted again hydronephrosis and right ureteral stent placement (1/29) admitted yesterday pulmonary edema. Has missed 2 dialysis sessions due to nausea/vomiting related to UTI vs ?bile leak. Gi recommended HIDA scan after dialysis today   Objective: Vitals:   02/21/18 0800 02/21/18 0914  BP: (!) 187/84 (!) 192/83  Pulse: (!) 59 62   Resp: 17 18  Temp:  97.7 F (36.5 C)  SpO2: 98% 98%    Intake/Output Summary (Last 24 hours) at 02/21/2018 0939 Last data filed at 02/21/2018 0121 Gross per 24 hour  Intake 100 ml  Output -  Net 100 ml   Filed Weights   02/20/18 2230 02/21/18 0914  Weight: 109.3 kg 115.9 kg    Exam:   General:  Awake alert oriented in no acute distress  Cardiovascular: rrr no mgr no LE edema left arm   Respiratory: normal effort BS distant diffuse faint crackles trace LE edema  Abdomen: obese soft +BS no guarding or rebounding  Musculoskeletal: joints without swelling/erythema   Data Reviewed: Basic Metabolic Panel: Recent Labs  Lab 02/20/18 1851  NA 140  K 3.9  CL 107  CO2 18*  GLUCOSE 184*  BUN 63*  CREATININE 10.47*  CALCIUM 8.5*   Liver Function Tests: Recent Labs  Lab 02/20/18 1851  AST 14*  ALT 14  ALKPHOS 86  BILITOT 0.5  PROT 7.1  ALBUMIN 2.9*   Recent Labs  Lab 02/20/18 1851  LIPASE 112*   No results for input(s): AMMONIA in the last 168 hours. CBC: Recent Labs  Lab 02/20/18 1851  WBC 10.2  HGB 9.7*  HCT 33.2*  MCV 99.7  PLT 390   Cardiac Enzymes: Recent Labs  Lab 02/20/18 2300 02/21/18 0401  TROPONINI 0.04* 0.04*   BNP (last 3 results) No results for input(s): BNP in the last 8760 hours.  ProBNP (last 3 results) No results for input(s): PROBNP in the last 8760 hours.  CBG: Recent Labs  Lab 02/21/18 0148 02/21/18 0520 02/21/18 0747  GLUCAP 108* 88 85    No results found for this or any previous visit (from the past 240 hour(s)).   Studies: Dg Chest 2 View  Result Date: 02/20/2018 CLINICAL DATA:  Shortness of breath for 2-3 days, history CHF, has not been taking Lasix, smoker, COPD, diabetes mellitus, hypertension, hyperlipidemia, GERD EXAM: CHEST - 2 VIEW COMPARISON:  01/30/2018 FINDINGS: Enlargement of cardiac silhouette with pulmonary vascular congestion. Mild pulmonary edema. Bibasilar pleural effusions and atelectasis  greater on LEFT. No pneumothorax. LEFT subclavian/axillary stent. Osseous structures unremarkable. Nephrostomy tube at upper abdomen on lateral view, not visualized on PA view. IMPRESSION: Enlargement of cardiac silhouette with pulmonary vascular congestion, pulmonary edema, and bibasilar pleural effusions/atelectasis LEFT greater than RIGHT. Electronically Signed   By: Lavonia Dana M.D.   On: 02/20/2018 19:20   Ct Angio Chest Pe W And/or Wo Contrast  Result Date: 02/20/2018 CLINICAL DATA:  Patient with shortness of breath, nausea and vomiting. EXAM: CT ANGIOGRAPHY CHEST CT ABDOMEN AND PELVIS WITH CONTRAST TECHNIQUE: Multidetector CT imaging of the chest was performed using the standard protocol during bolus administration of intravenous contrast. Multiplanar CT image reconstructions and MIPs were obtained to evaluate the vascular anatomy. Multidetector CT imaging of the abdomen and pelvis was performed using the standard protocol during bolus administration of intravenous contrast. CONTRAST:  175mL ISOVUE-370 IOPAMIDOL (ISOVUE-370) INJECTION 76% COMPARISON:  CT abdomen pelvis 02/08/2018 FINDINGS: CTA CHEST FINDINGS Cardiovascular: Heart is enlarged. Small pericardial effusion. Thoracic aortic vascular calcifications. Main pulmonary artery is dilated as can be seen with pulmonary arterial hypertension. Coronary arterial vascular calcifications. Adequate opacification of the main pulmonary artery. Motion artifact limits evaluation. No abnormal filling defects identified to suggest acute pulmonary embolus. Mediastinum/Nodes: 1.8 cm right paratracheal lymph node (image 47; series 5). 2.2 cm right hilar node (image 56; series 5). Small hiatal hernia. Otherwise normal appearance of the esophagus. Lungs/Pleura: Expiratory phase imaging. There is a large left and small right pleural effusion. Large area of consolidation throughout the left lower lobe. Patchy ground-glass opacities throughout the aerated left upper lobe.  Patchy ground-glass opacities throughout the aerated right lung. 4 mm right middle lobe nodule (image 67; series 6). 5 mm right middle lobe nodule (image 75; series 6). Nodules are similar when compared to prior exam, suggestive of benign etiology. Interlobular septal thickening. Fluid along the fissures. No pneumothorax. Musculoskeletal: No aggressive or acute appearing osseous lesions. Stable sclerosis within the posterior right fifth rib. Review of the MIP images confirms the above findings. CT ABDOMEN and PELVIS FINDINGS Hepatobiliary: Liver is normal in size and contour. Re demonstrated 5.0 x 3.9 cm fluid collection within the gallbladder fossa (image 41; series 12). Interval development of small amount of perihepatic fluid (image 18; series 12). Mild heterogeneous enhancement of the adjacent hepatic parenchyma (image 49; series 12). Fatty deposition adjacent to the falciform ligament. No intrahepatic biliary ductal dilatation. Pancreas: Unremarkable Spleen: Normal in size without focal abnormality. Adrenals/Urinary Tract: Normal adrenal glands. Kidneys enhance symmetrically with contrast. Interval insertion right double-J nephroureteral stent which appears in appropriate position. Marked wall thickening of the urinary bladder. Stomach/Bowel: No abnormal bowel wall thickening or evidence for bowel obstruction. No free fluid or free intraperitoneal air. Vascular/Lymphatic: Normal caliber abdominal aorta. Peripheral calcified atherosclerotic plaque. Prominent subcentimeter retroperitoneal lymph nodes. Reproductive: Heterogeneous prostate. Other: None. Musculoskeletal: No aggressive or acute appearing osseous lesions. Lumbar spine degenerative changes. Review of the MIP images confirms the above findings.  IMPRESSION: Chest: 1. Large left and small right pleural effusions. Consolidation within the left lower lobe may represent atelectasis or infection. Patchy ground-glass opacities throughout the aerated lungs  bilaterally favored to represent edema. Atypical infectious process not excluded. 2. No evidence for acute pulmonary embolus. 3. Similar-appearing mediastinal adenopathy, potentially reactive in etiology. Abdomen pelvis: 1. There is a residual fluid collection within the gallbladder fossa which is similar in size when compared to recent abdomen pelvic CT. Additionally, there is new perihepatic fluid. Possibility of bile leak not entirely excluded. Consider further evaluation with HIDA exam as clinically indicated. 2. Interval insertion right-sided double-J nephroureteral stent. No residual hydronephrosis. 3. Marked wall thickening of the urinary bladder. Recommend correlation with urinalysis to exclude the possibility of cystitis. Electronically Signed   By: Lovey Newcomer M.D.   On: 02/20/2018 21:08   Ct Abdomen Pelvis W Contrast  Result Date: 02/20/2018 CLINICAL DATA:  Patient with shortness of breath, nausea and vomiting. EXAM: CT ANGIOGRAPHY CHEST CT ABDOMEN AND PELVIS WITH CONTRAST TECHNIQUE: Multidetector CT imaging of the chest was performed using the standard protocol during bolus administration of intravenous contrast. Multiplanar CT image reconstructions and MIPs were obtained to evaluate the vascular anatomy. Multidetector CT imaging of the abdomen and pelvis was performed using the standard protocol during bolus administration of intravenous contrast. CONTRAST:  117mL ISOVUE-370 IOPAMIDOL (ISOVUE-370) INJECTION 76% COMPARISON:  CT abdomen pelvis 02/08/2018 FINDINGS: CTA CHEST FINDINGS Cardiovascular: Heart is enlarged. Small pericardial effusion. Thoracic aortic vascular calcifications. Main pulmonary artery is dilated as can be seen with pulmonary arterial hypertension. Coronary arterial vascular calcifications. Adequate opacification of the main pulmonary artery. Motion artifact limits evaluation. No abnormal filling defects identified to suggest acute pulmonary embolus. Mediastinum/Nodes: 1.8 cm right  paratracheal lymph node (image 47; series 5). 2.2 cm right hilar node (image 56; series 5). Small hiatal hernia. Otherwise normal appearance of the esophagus. Lungs/Pleura: Expiratory phase imaging. There is a large left and small right pleural effusion. Large area of consolidation throughout the left lower lobe. Patchy ground-glass opacities throughout the aerated left upper lobe. Patchy ground-glass opacities throughout the aerated right lung. 4 mm right middle lobe nodule (image 67; series 6). 5 mm right middle lobe nodule (image 75; series 6). Nodules are similar when compared to prior exam, suggestive of benign etiology. Interlobular septal thickening. Fluid along the fissures. No pneumothorax. Musculoskeletal: No aggressive or acute appearing osseous lesions. Stable sclerosis within the posterior right fifth rib. Review of the MIP images confirms the above findings. CT ABDOMEN and PELVIS FINDINGS Hepatobiliary: Liver is normal in size and contour. Re demonstrated 5.0 x 3.9 cm fluid collection within the gallbladder fossa (image 41; series 12). Interval development of small amount of perihepatic fluid (image 18; series 12). Mild heterogeneous enhancement of the adjacent hepatic parenchyma (image 49; series 12). Fatty deposition adjacent to the falciform ligament. No intrahepatic biliary ductal dilatation. Pancreas: Unremarkable Spleen: Normal in size without focal abnormality. Adrenals/Urinary Tract: Normal adrenal glands. Kidneys enhance symmetrically with contrast. Interval insertion right double-J nephroureteral stent which appears in appropriate position. Marked wall thickening of the urinary bladder. Stomach/Bowel: No abnormal bowel wall thickening or evidence for bowel obstruction. No free fluid or free intraperitoneal air. Vascular/Lymphatic: Normal caliber abdominal aorta. Peripheral calcified atherosclerotic plaque. Prominent subcentimeter retroperitoneal lymph nodes. Reproductive: Heterogeneous  prostate. Other: None. Musculoskeletal: No aggressive or acute appearing osseous lesions. Lumbar spine degenerative changes. Review of the MIP images confirms the above findings. IMPRESSION: Chest: 1. Large left and small right pleural  effusions. Consolidation within the left lower lobe may represent atelectasis or infection. Patchy ground-glass opacities throughout the aerated lungs bilaterally favored to represent edema. Atypical infectious process not excluded. 2. No evidence for acute pulmonary embolus. 3. Similar-appearing mediastinal adenopathy, potentially reactive in etiology. Abdomen pelvis: 1. There is a residual fluid collection within the gallbladder fossa which is similar in size when compared to recent abdomen pelvic CT. Additionally, there is new perihepatic fluid. Possibility of bile leak not entirely excluded. Consider further evaluation with HIDA exam as clinically indicated. 2. Interval insertion right-sided double-J nephroureteral stent. No residual hydronephrosis. 3. Marked wall thickening of the urinary bladder. Recommend correlation with urinalysis to exclude the possibility of cystitis. Electronically Signed   By: Lovey Newcomer M.D.   On: 02/20/2018 21:08    Scheduled Meds: . aspirin EC  81 mg Oral QHS  . calcium carbonate  800 mg of elemental calcium Oral TID WC  . dorzolamide-timolol  1 drop Both Eyes BID  . DULoxetine  60 mg Oral BID  . heparin  5,000 Units Subcutaneous Q8H  . insulin aspart  0-9 Units Subcutaneous Q4H  . insulin glargine  10 Units Subcutaneous Daily  . isosorbide mononitrate  120 mg Oral Daily  . labetalol  300 mg Oral BID  . NIFEdipine  60 mg Oral Daily  . nitroGLYCERIN  1 inch Topical Q6H  . pantoprazole  40 mg Oral Daily  . torsemide  20 mg Oral BID  . traZODone  50-100 mg Oral QHS   Continuous Infusions: . [START ON 02/22/2018] ceFEPime (MAXIPIME) IV      Principal Problem:   Acute pulmonary edema (HCC) Active Problems:   ESRD on dialysis  ([place])   Acute lower UTI   HTN (hypertension)   Diabetes type 2, controlled (HCC)   Tobacco use   COPD (chronic obstructive pulmonary disease) (HCC)   Post-cholecystectomy syndrome   Polysubstance abuse ([place])    Time spent: 41 minutes    [place] NP  [facility]  If 7PM-7AM, please contact night-coverage at [email or web address], password [facility] 02/21/2018, 9:39 AM  LOS: 0 days

## 2018-02-21 NOTE — Consult Note (Signed)
[facility]    NEPHROLOGY CONSULTATION NOTE  PATIENT ID:  Jon Russell, DOB:  Jan 06, 1961  HPI: The patient is a 58 y.o. year old male patient with a past medical history significant for end-stage renal disease on hemodialysis Tuesday, Thursday, Saturday, congestive heart failure, COPD, diabetes, hypertension, and polysubstance abuse who was admitted to [facility] on 01/30/2018 with complaints of abdominal pain nausea and vomiting and was found to have chronic cholecystitis and underwent a laparoscopic cholecystectomy on 02/04/2018.  He was then discharged but then readmitted for persistent nausea, vomiting, chest pain, and shortness of breath.  He was found to have hydronephrosis and a right ureteral stent was placed by urology on 02/09/2018 with plan to remove in 2 to 3 weeks.  His last hemodialysis treatment was last week (02/13/2018).  He reports he was too weak and in too much pain to go to dialysis for his other 2 treatments.  He presents to the emergency department today with shortness of breath that has been worsening over the past 6 days.  He has associated intermittent substernal chest pain.  He also complained of an episode of hematuria.  He also complained of abdominal discomfort and nausea.  Renal consultation has been called for end-stage renal disease on hemodialysis.   Past Medical History:  Diagnosis Date  . Anemia   . Anginal pain ([place])   . Back pain   . Chronic diastolic CHF (congestive heart failure) ([place])   . COPD (chronic obstructive pulmonary disease) ([place])   . Coronary artery disease   . Depression   . Diabetes mellitus   . Diabetic retinopathy ([place])    Legally blind  . GERD (gastroesophageal reflux disease)   . Hyperlipemia   . Hypertension    dr bensimhon  . Renal insufficiency    not on dialysis; 07/2011: baseline Cr 1.8-2.0  . Shortness of breath   . Sleep apnea    USES CPAP    Past Surgical History:  Procedure Laterality Date  . AV FISTULA  PLACEMENT  05/05/2011   Procedure: ARTERIOVENOUS (AV) FISTULA CREATION;  Surgeon: Elam Dutch, MD;  Location: [facility];  Service: Vascular;  Laterality: Left;  Left Radial Cephalic Arteriovenous Fistula Creation  . EYE SURGERY     legally blind both eyes  . LUNG BIOPSY      Family History  Problem Relation Age of Onset  . Diabetes Mother   . Heart disease Mother   . Hyperlipidemia Mother   . Hypertension Mother   . Diabetes Father   . Heart disease Father   . Hyperlipidemia Father   . Hypertension Father   . Diabetes Sister   . Hyperlipidemia Sister   . Hypertension Sister   . Diabetes Brother   . Heart disease Brother   . Hyperlipidemia Brother   . Hypertension Brother   . Heart attack Brother   . Other Brother        Amputation    Social History   Tobacco Use  . Smoking status: Current Every Day Smoker    Packs/day: 1.00    Years: 30.00    Pack years: 30.00    Types: Cigarettes  . Smokeless tobacco: Never Used  Substance Use Topics  . Alcohol use: Yes    Comment: states he drinks 1/5 of liquor dailt- last drink 30 days.   no etoh x 31mos       ys ago  . Drug use: Yes    Types: Cocaine  Comment: states last used 30 days ago 2012    REVIEW OF SYSTEMS: General: Positive fatigue and weakness Head:  no headaches Eyes:  no blurred vision ENT:  no sore throat Neck:  no masses CV:  no chest pain, no orthopnea Lungs:  no shortness of breath, no cough GI: Positive nausea GU: Positive hematuria Skin:  no rashes or lesions Neuro:  no focal numbness or weakness Psych:  no depression or anxiety    PHYSICAL EXAM:  Vitals:   02/21/18 0800 02/21/18 0914  BP: (!) 187/84 (!) 192/83  Pulse: (!) 59 62  Resp: 17 18  Temp:  97.7 F (36.5 C)  SpO2: 98% 98%   I/O last 3 completed shifts: In: 100 [IV Piggyback:100] Out: -    General:  AAOx3 NAD HEENT: MMM [place] AT anicteric sclera Neck: Positive JVD, no adenopathy CV:  Heart RRR  Lungs:  L/S with bilateral  bibasilar Rales Abd:  abd SNT/ND with normal BS GU:  Bladder non-palpable Extremities: +1 bilateral LE edema. Skin:  No skin rash Psych:  normal mood and affect Neuro:  no focal deficits   CURRENT MEDICATIONS:  . aspirin EC  81 mg Oral QHS  . calcium carbonate  800 mg of elemental calcium Oral TID WC  . dorzolamide-timolol  1 drop Both Eyes BID  . DULoxetine  60 mg Oral BID  . heparin  5,000 Units Subcutaneous Q8H  . insulin aspart  0-9 Units Subcutaneous Q4H  . insulin glargine  10 Units Subcutaneous Daily  . isosorbide mononitrate  120 mg Oral Daily  . labetalol  300 mg Oral BID  . NIFEdipine  60 mg Oral Daily  . nitroGLYCERIN  1 inch Topical Q6H  . pantoprazole  40 mg Oral Daily  . torsemide  20 mg Oral BID  . traZODone  50-100 mg Oral QHS     HOME MEDICATIONS:  Prior to Admission medications   Medication Sig Start Date End Date Taking? Authorizing Provider  acetaminophen (TYLENOL) 500 MG tablet Take 1,000 mg by mouth every 6 (six) hours as needed for mild pain.    Yes [provider]  albuterol (PROVENTIL HFA;VENTOLIN HFA) 108 (90 Base) MCG/ACT inhaler Inhale 2 puffs into the lungs every 6 (six) hours as needed for wheezing or shortness of breath.   Yes [provider]  aspirin 81 MG EC tablet Take 81 mg by mouth at bedtime.    Yes [provider]  calcium carbonate (TUMS EX) 750 MG chewable tablet Chew 2-3 tablets by mouth See admin instructions. Take 3 tablets by mouth with meals and 2 tablets with snacks   Yes [provider]  dorzolamide-timolol (COSOPT) 22.3-6.8 MG/ML ophthalmic solution Place 1 drop into both eyes 2 (two) times daily.    Yes [provider]  DULoxetine (CYMBALTA) 60 MG capsule Take 60 mg by mouth 2 (two) times daily.    Yes [provider]  insulin aspart (NOVOLOG FLEXPEN) 100 UNIT/ML FlexPen Inject 4-12 Units into the skin 3 (three) times daily as needed for high blood sugar (CBG 120). Per sliding  scale based on CBG   Yes [provider]  Insulin Detemir (LEVEMIR FLEXTOUCH) 100 UNIT/ML Pen Inject 20 Units into the skin daily before breakfast.   Yes [provider]  isosorbide mononitrate (IMDUR) 120 MG 24 hr tablet Take 120 mg by mouth daily.   Yes [provider]  labetalol (NORMODYNE) 300 MG tablet Take 300 mg by mouth 2 (two) times daily.  Yes [provider]  NIFEdipine (PROCARDIA XL/ADALAT-CC) 60 MG 24 hr tablet Take 60 mg by mouth daily.  12/29/10  Yes Bensimhon, Shaune Pascal, MD  oxyCODONE (ROXICODONE) 15 MG immediate release tablet Take 15 mg by mouth every 6 (six) hours as needed for pain.   Yes [provider]  OXYGEN Inhale 3 L into the lungs continuous.   Yes [provider]  pantoprazole (PROTONIX) 40 MG tablet Take 40 mg by mouth daily.    Yes Bensimhon, Shaune Pascal, MD  torsemide (DEMADEX) 20 MG tablet Take 20 mg by mouth 2 (two) times daily.    Yes Wynetta Emery, PA-C  traZODone (DESYREL) 50 MG tablet Take 50-100 mg by mouth at bedtime.    Yes [provider]       LABS:  CBC Latest Ref Rng & Units 02/20/2018 11/13/2016 11/11/2016  WBC 4.0 - 10.5 K/uL 10.2 7.6 7.8  Hemoglobin 13.0 - 17.0 g/dL 9.7(L) 11.4(L) 10.4(L)  Hematocrit 39.0 - 52.0 % 33.2(L) 36.6(L) 34.3(L)  Platelets 150 - 400 K/uL 390 284 286    CMP Latest Ref Rng & Units 02/20/2018 11/13/2016 11/12/2016  Glucose 70 - 99 mg/dL 184(H) 206(H) 196(H)  BUN 6 - 20 mg/dL 63(H) 13 21(H)  Creatinine 0.61 - 1.24 mg/dL 10.47(H) 3.48(H) 4.63(H)  Sodium 135 - 145 mmol/L 140 135 135  Potassium 3.5 - 5.1 mmol/L 3.9 3.8 4.6  Chloride 98 - 111 mmol/L 107 96(L) 100(L)  CO2 22 - 32 mmol/L 18(L) 29 25  Calcium 8.9 - 10.3 mg/dL 8.5(L) 9.1 8.4(L)  Total Protein 6.5 - 8.1 g/dL 7.1 - -  Total Bilirubin 0.3 - 1.2 mg/dL 0.5 - -  Alkaline Phos 38 - 126 U/L 86 - -  AST 15 - 41 U/L 14(L) - -  ALT 0 - 44 U/L 14 - -    Lab Results  Component Value Date   CALCIUM 8.5 (L)  02/20/2018   PHOS 4.5 11/12/2016       Component Value Date/Time   COLORURINE YELLOW 02/20/2018 2054   APPEARANCEUR CLOUDY (A) 02/20/2018 2054   LABSPEC 1.016 02/20/2018 2054   PHURINE 5.0 02/20/2018 2054   GLUCOSEU NEGATIVE 02/20/2018 2054   HGBUR MODERATE (A) 02/20/2018 2054   [place] NEGATIVE 02/20/2018 2054   [place] NEGATIVE 02/20/2018 2054   PROTEINUR 100 (A) 02/20/2018 2054   NITRITE NEGATIVE 02/20/2018 2054   LEUKOCYTESUR LARGE (A) 02/20/2018 2054   No results found for: PHART, PCO2ART, PO2ART, HCO3, TCO2, ACIDBASEDEF, O2SAT  No results found for: IRON, TIBC, FERRITIN, IRONPCTSAT     ASSESSMENT/PLAN:     Problem List Items Addressed This Visit    None    Visit Diagnoses    Other postoperative complication involving digestive system    -  Primary   Pain       Relevant Orders   DG Chest 2 View (Completed)   Pleural effusion       Acute cystitis with hematuria          1.  End-stage renal disease on hemodialysis.  We will plan for dialysis today, then resume normal Tuesday, Thursday, Saturday dialysis schedule.  2.  Hematuria.  Does have a urinalysis consistent with urinary tract infection.  Would treat with antibiotics as per his primary team's recommendations.  3.  Abdominal pain with recent cholecystectomy.  Is being seen by GI and evaluated for a possible bile leak.  4.  Anemia.  Check iron stores.  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  Is on calcium carbonate.  6.  Diabetes.  Continue insulin with sliding scale.    [place], DO, [place]

## 2018-02-22 DIAGNOSIS — K9189 Other postprocedural complications and disorders of digestive system: Secondary | ICD-10-CM | POA: Diagnosis not present

## 2018-02-22 DIAGNOSIS — R52 Pain, unspecified: Secondary | ICD-10-CM

## 2018-02-22 DIAGNOSIS — R935 Abnormal findings on diagnostic imaging of other abdominal regions, including retroperitoneum: Secondary | ICD-10-CM | POA: Diagnosis not present

## 2018-02-22 DIAGNOSIS — N3001 Acute cystitis with hematuria: Secondary | ICD-10-CM | POA: Diagnosis not present

## 2018-02-22 DIAGNOSIS — J810 Acute pulmonary edema: Secondary | ICD-10-CM | POA: Diagnosis not present

## 2018-02-22 LAB — HEMOGLOBIN A1C
Hgb A1c MFr Bld: 7.9 % — ABNORMAL HIGH (ref 4.8–5.6)
Mean Plasma Glucose: 180.03 mg/dL

## 2018-02-22 LAB — COMPREHENSIVE METABOLIC PANEL
ALT: 12 U/L (ref 0–44)
AST: 13 U/L — ABNORMAL LOW (ref 15–41)
Albumin: 2.8 g/dL — ABNORMAL LOW (ref 3.5–5.0)
Alkaline Phosphatase: 83 U/L (ref 38–126)
Anion gap: 11 (ref 5–15)
BUN: 33 mg/dL — ABNORMAL HIGH (ref 6–20)
CO2: 26 mmol/L (ref 22–32)
Calcium: 8.5 mg/dL — ABNORMAL LOW (ref 8.9–10.3)
Chloride: 101 mmol/L (ref 98–111)
Creatinine, Ser: 6.34 mg/dL — ABNORMAL HIGH (ref 0.61–1.24)
GFR calc Af Amer: 10 mL/min — ABNORMAL LOW (ref >60)
GFR, EST NON AFRICAN AMERICAN: 9 mL/min — AB (ref >60)
Glucose, Bld: 108 mg/dL — ABNORMAL HIGH (ref 70–99)
Potassium: 4.3 mmol/L (ref 3.5–5.1)
Sodium: 138 mmol/L (ref 135–145)
TOTAL PROTEIN: 7 g/dL (ref 6.5–8.1)
Total Bilirubin: 0.7 mg/dL (ref 0.3–1.2)

## 2018-02-22 LAB — GLUCOSE, CAPILLARY
GLUCOSE-CAPILLARY: 97 mg/dL (ref 70–99)
GLUCOSE-CAPILLARY: 140 mg/dL — AB (ref 70–99)
Glucose-Capillary: 102 mg/dL — ABNORMAL HIGH (ref 70–99)
Glucose-Capillary: 143 mg/dL — ABNORMAL HIGH (ref 70–99)

## 2018-02-22 LAB — HEPATITIS B SURFACE ANTIGEN: Hepatitis B Surface Ag: NEGATIVE

## 2018-02-22 LAB — URINE CULTURE: Culture: NO GROWTH

## 2018-02-22 LAB — CBC
HCT: 30.9 % — ABNORMAL LOW (ref 39.0–52.0)
HEMOGLOBIN: 9.3 g/dL — AB (ref 13.0–17.0)
MCH: 29.3 pg (ref 26.0–34.0)
MCHC: 30.1 g/dL (ref 30.0–36.0)
MCV: 97.5 fL (ref 80.0–100.0)
Platelets: 326 10*3/uL (ref 150–400)
RBC: 3.17 MIL/uL — AB (ref 4.22–5.81)
RDW: 13.7 % (ref 11.5–15.5)
WBC: 7.3 10*3/uL (ref 4.0–10.5)
nRBC: 0 % (ref 0.0–0.2)

## 2018-02-22 LAB — HEPATITIS B E ANTIBODY: Hep B E Ab: NEGATIVE

## 2018-02-22 LAB — HEPATITIS B CORE ANTIBODY, TOTAL: Hep B Core Total Ab: NEGATIVE

## 2018-02-22 MED ORDER — HEPARIN SODIUM (PORCINE) 1000 UNIT/ML DIALYSIS
1000.0000 [IU] | INTRAMUSCULAR | Status: DC | PRN
  Filled 2018-02-22: qty 1

## 2018-02-22 MED ORDER — ALTEPLASE 2 MG IJ SOLR
2.0000 mg | Freq: Once | INTRAMUSCULAR | Status: DC | PRN
  Filled 2018-02-22: qty 2

## 2018-02-22 MED ORDER — LIDOCAINE-PRILOCAINE 2.5-2.5 % EX CREA: 1.0000 "application " | TOPICAL_CREAM | CUTANEOUS | Status: DC | PRN

## 2018-02-22 MED ORDER — OXYCODONE HCL 5 MG PO TABS
ORAL_TABLET | ORAL | Status: AC
  Administered 2018-02-22: 15 mg via ORAL
  Filled 2018-02-22: qty 3

## 2018-02-22 MED ORDER — DEXTROSE 5 % IV SOLN
500.0000 mg | Freq: Once | INTRAVENOUS | Status: AC
  Administered 2018-02-22: 500 mg via INTRAVENOUS
  Filled 2018-02-22: qty 0.5

## 2018-02-22 MED ORDER — LACTULOSE 10 GM/15ML PO SOLN
20.0000 g | Freq: Once | ORAL | Status: AC
  Administered 2018-02-22: 20 g via ORAL
  Filled 2018-02-22: qty 30

## 2018-02-22 MED ORDER — SODIUM CHLORIDE 0.9 % IV SOLN: 100.0000 mL | INTRAVENOUS | Status: DC | PRN

## 2018-02-22 MED ORDER — SENNOSIDES-DOCUSATE SODIUM 8.6-50 MG PO TABS
1.0000 | ORAL_TABLET | Freq: Two times a day (BID) | ORAL | Status: DC
  Administered 2018-02-22: 1 via ORAL
  Filled 2018-02-22: qty 1

## 2018-02-22 MED ORDER — CHLORHEXIDINE GLUCONATE CLOTH 2 % EX PADS: 6.0000 | MEDICATED_PAD | Freq: Every day | CUTANEOUS | Status: DC

## 2018-02-22 MED ORDER — MUPIROCIN 2 % EX OINT: 1.0000 "application " | TOPICAL_OINTMENT | Freq: Two times a day (BID) | CUTANEOUS | Status: DC

## 2018-02-22 MED ORDER — LIDOCAINE HCL (PF) 1 % IJ SOLN: 5.0000 mL | INTRAMUSCULAR | Status: DC | PRN

## 2018-02-22 MED ORDER — CIPROFLOXACIN HCL 500 MG PO TABS: 500.0000 mg | ORAL_TABLET | Freq: Two times a day (BID) | ORAL | 0 refills | Status: AC

## 2018-02-22 MED ORDER — PENTAFLUOROPROP-TETRAFLUOROETH EX AERO: 1.0000 "application " | INHALATION_SPRAY | CUTANEOUS | Status: DC | PRN

## 2018-02-22 NOTE — Care Management Obs Status (Signed)
MEDICARE OBSERVATION STATUS NOTIFICATION   Patient Details  Name: Jon Russell MRN: [id number] Date of Birth: 11/12/1960   Medicare Observation Status Notification Given:  Yes  Patient on Contact Isolation with permission given by patient for CM to sign.   Midge Minium RN, BSN, NCM-BC, ACM-RN [phone] 02/22/2018, 9:09 AM

## 2018-02-22 NOTE — Progress Notes (Signed)
GI UPDATE  In regards to the question of a possible bile leak based on CT imaging, the HIDA scan negative, no evidence of a bile leak. LFTs normal. This is reassuring. No further evaluation is warranted for this.   Please call with any additional questions, otherwise will sign off for now.   [place] Cellar, MD [facility]

## 2018-02-22 NOTE — Progress Notes (Addendum)
Pt is being discharged with wife in a wheelchair.  He's A&Ox4.  Paperwork and instructions reviewed with pt and wife for medications and f/u appointments.  He had a emesis episode before d/c.  Zofran given and pt stated that he was fine to leave and wanted to leave.  Paged MD about pt's high bp before discharge and she stated ok for pt to discharge.

## 2018-02-22 NOTE — Progress Notes (Signed)
[facility] Progress Note   Subjective:   No complaints, discharging today after HD  Objective Vitals:   02/21/18 1950 02/21/18 2300 02/22/18 0556 02/22/18 1108  BP: (!) 178/84 (!) 163/64 (!) 166/55 (!) 165/73  Pulse: 66 70 66 65  Resp: 18 18 18 18   Temp: 98.4 F (36.9 C) 99.1 F (37.3 C) 98.3 F (36.8 C) 98.4 F (36.9 C)  TempSrc: Oral Oral Oral Oral  SpO2: 100% 93% 93% 93%  Weight: 108.3 kg     Height:       Physical Exam General: appears well lying in bed Heart: RRR Lungs: clear Abdomen: soft Extremities: trace edema Dialysis Access: LUE AVF  Additional Objective Labs: Basic Metabolic Panel: Recent Labs  Lab 02/20/18 1851 02/22/18 0731  NA 140 138  K 3.9 4.3  CL 107 101  CO2 18* 26  GLUCOSE 184* 108*  BUN 63* 33*  CREATININE 10.47* 6.34*  CALCIUM 8.5* 8.5*   Liver Function Tests: Recent Labs  Lab 02/20/18 1851 02/22/18 0731  AST 14* 13*  ALT 14 12  ALKPHOS 86 83  BILITOT 0.5 0.7  PROT 7.1 7.0  ALBUMIN 2.9* 2.8*   Recent Labs  Lab 02/20/18 1851  LIPASE 112*   CBC: Recent Labs  Lab 02/20/18 1851  WBC 10.2  HGB 9.7*  HCT 33.2*  MCV 99.7  PLT 390   Blood Culture    Component Value Date/Time   SDES URINE, RANDOM 02/21/2018 0741   SPECREQUEST NONE 02/21/2018 0741   CULT  02/21/2018 0741    NO GROWTH Performed at [facility], [place] [address] [place], [place] [address]    REPTSTATUS 02/22/2018 FINAL 02/21/2018 0741    Cardiac Enzymes: Recent Labs  Lab 02/20/18 2300 02/21/18 0401 02/21/18 1000  TROPONINI 0.04* 0.04* 0.03*   CBG: Recent Labs  Lab 02/21/18 2037 02/22/18 0029 02/22/18 0326 02/22/18 0742 02/22/18 1222  GLUCAP 167* 143* 102* 97 140*   Iron Studies:  Recent Labs    02/21/18 1105  IRON 40*  TIBC 209*  FERRITIN 667*   @lablastinr3 @ Studies/Results: Dg Chest 2 View  Result Date: 02/20/2018 CLINICAL DATA:  Shortness of breath for 2-3 days, history CHF, has not been taking Lasix,  smoker, COPD, diabetes mellitus, hypertension, hyperlipidemia, GERD EXAM: CHEST - 2 VIEW COMPARISON:  01/30/2018 FINDINGS: Enlargement of cardiac silhouette with pulmonary vascular congestion. Mild pulmonary edema. Bibasilar pleural effusions and atelectasis greater on LEFT. No pneumothorax. LEFT subclavian/axillary stent. Osseous structures unremarkable. Nephrostomy tube at upper abdomen on lateral view, not visualized on PA view. IMPRESSION: Enlargement of cardiac silhouette with pulmonary vascular congestion, pulmonary edema, and bibasilar pleural effusions/atelectasis LEFT greater than RIGHT. Electronically Signed   By: Lavonia Dana M.D.   On: 02/20/2018 19:20   Ct Angio Chest Pe W And/or Wo Contrast  Result Date: 02/20/2018 CLINICAL DATA:  Patient with shortness of breath, nausea and vomiting. EXAM: CT ANGIOGRAPHY CHEST CT ABDOMEN AND PELVIS WITH CONTRAST TECHNIQUE: Multidetector CT imaging of the chest was performed using the standard protocol during bolus administration of intravenous contrast. Multiplanar CT image reconstructions and MIPs were obtained to evaluate the vascular anatomy. Multidetector CT imaging of the abdomen and pelvis was performed using the standard protocol during bolus administration of intravenous contrast. CONTRAST:  172mL ISOVUE-370 IOPAMIDOL (ISOVUE-370) INJECTION 76% COMPARISON:  CT abdomen pelvis 02/08/2018 FINDINGS: CTA CHEST FINDINGS Cardiovascular: Heart is enlarged. Small pericardial effusion. Thoracic aortic vascular calcifications. Main pulmonary artery is dilated as can be seen with  pulmonary arterial hypertension. Coronary arterial vascular calcifications. Adequate opacification of the main pulmonary artery. Motion artifact limits evaluation. No abnormal filling defects identified to suggest acute pulmonary embolus. Mediastinum/Nodes: 1.8 cm right paratracheal lymph node (image 47; series 5). 2.2 cm right hilar node (image 56; series 5). Small hiatal hernia. Otherwise  normal appearance of the esophagus. Lungs/Pleura: Expiratory phase imaging. There is a large left and small right pleural effusion. Large area of consolidation throughout the left lower lobe. Patchy ground-glass opacities throughout the aerated left upper lobe. Patchy ground-glass opacities throughout the aerated right lung. 4 mm right middle lobe nodule (image 67; series 6). 5 mm right middle lobe nodule (image 75; series 6). Nodules are similar when compared to prior exam, suggestive of benign etiology. Interlobular septal thickening. Fluid along the fissures. No pneumothorax. Musculoskeletal: No aggressive or acute appearing osseous lesions. Stable sclerosis within the posterior right fifth rib. Review of the MIP images confirms the above findings. CT ABDOMEN and PELVIS FINDINGS Hepatobiliary: Liver is normal in size and contour. Re demonstrated 5.0 x 3.9 cm fluid collection within the gallbladder fossa (image 41; series 12). Interval development of small amount of perihepatic fluid (image 18; series 12). Mild heterogeneous enhancement of the adjacent hepatic parenchyma (image 49; series 12). Fatty deposition adjacent to the falciform ligament. No intrahepatic biliary ductal dilatation. Pancreas: Unremarkable Spleen: Normal in size without focal abnormality. Adrenals/Urinary Tract: Normal adrenal glands. Kidneys enhance symmetrically with contrast. Interval insertion right double-J nephroureteral stent which appears in appropriate position. Marked wall thickening of the urinary bladder. Stomach/Bowel: No abnormal bowel wall thickening or evidence for bowel obstruction. No free fluid or free intraperitoneal air. Vascular/Lymphatic: Normal caliber abdominal aorta. Peripheral calcified atherosclerotic plaque. Prominent subcentimeter retroperitoneal lymph nodes. Reproductive: Heterogeneous prostate. Other: None. Musculoskeletal: No aggressive or acute appearing osseous lesions. Lumbar spine degenerative changes.  Review of the MIP images confirms the above findings. IMPRESSION: Chest: 1. Large left and small right pleural effusions. Consolidation within the left lower lobe may represent atelectasis or infection. Patchy ground-glass opacities throughout the aerated lungs bilaterally favored to represent edema. Atypical infectious process not excluded. 2. No evidence for acute pulmonary embolus. 3. Similar-appearing mediastinal adenopathy, potentially reactive in etiology. Abdomen pelvis: 1. There is a residual fluid collection within the gallbladder fossa which is similar in size when compared to recent abdomen pelvic CT. Additionally, there is new perihepatic fluid. Possibility of bile leak not entirely excluded. Consider further evaluation with HIDA exam as clinically indicated. 2. Interval insertion right-sided double-J nephroureteral stent. No residual hydronephrosis. 3. Marked wall thickening of the urinary bladder. Recommend correlation with urinalysis to exclude the possibility of cystitis. Electronically Signed   By: Lovey Newcomer M.D.   On: 02/20/2018 21:08   Ct Abdomen Pelvis W Contrast  Result Date: 02/20/2018 CLINICAL DATA:  Patient with shortness of breath, nausea and vomiting. EXAM: CT ANGIOGRAPHY CHEST CT ABDOMEN AND PELVIS WITH CONTRAST TECHNIQUE: Multidetector CT imaging of the chest was performed using the standard protocol during bolus administration of intravenous contrast. Multiplanar CT image reconstructions and MIPs were obtained to evaluate the vascular anatomy. Multidetector CT imaging of the abdomen and pelvis was performed using the standard protocol during bolus administration of intravenous contrast. CONTRAST:  176mL ISOVUE-370 IOPAMIDOL (ISOVUE-370) INJECTION 76% COMPARISON:  CT abdomen pelvis 02/08/2018 FINDINGS: CTA CHEST FINDINGS Cardiovascular: Heart is enlarged. Small pericardial effusion. Thoracic aortic vascular calcifications. Main pulmonary artery is dilated as can be seen with  pulmonary arterial hypertension. Coronary arterial vascular calcifications. Adequate opacification  of the main pulmonary artery. Motion artifact limits evaluation. No abnormal filling defects identified to suggest acute pulmonary embolus. Mediastinum/Nodes: 1.8 cm right paratracheal lymph node (image 47; series 5). 2.2 cm right hilar node (image 56; series 5). Small hiatal hernia. Otherwise normal appearance of the esophagus. Lungs/Pleura: Expiratory phase imaging. There is a large left and small right pleural effusion. Large area of consolidation throughout the left lower lobe. Patchy ground-glass opacities throughout the aerated left upper lobe. Patchy ground-glass opacities throughout the aerated right lung. 4 mm right middle lobe nodule (image 67; series 6). 5 mm right middle lobe nodule (image 75; series 6). Nodules are similar when compared to prior exam, suggestive of benign etiology. Interlobular septal thickening. Fluid along the fissures. No pneumothorax. Musculoskeletal: No aggressive or acute appearing osseous lesions. Stable sclerosis within the posterior right fifth rib. Review of the MIP images confirms the above findings. CT ABDOMEN and PELVIS FINDINGS Hepatobiliary: Liver is normal in size and contour. Re demonstrated 5.0 x 3.9 cm fluid collection within the gallbladder fossa (image 41; series 12). Interval development of small amount of perihepatic fluid (image 18; series 12). Mild heterogeneous enhancement of the adjacent hepatic parenchyma (image 49; series 12). Fatty deposition adjacent to the falciform ligament. No intrahepatic biliary ductal dilatation. Pancreas: Unremarkable Spleen: Normal in size without focal abnormality. Adrenals/Urinary Tract: Normal adrenal glands. Kidneys enhance symmetrically with contrast. Interval insertion right double-J nephroureteral stent which appears in appropriate position. Marked wall thickening of the urinary bladder. Stomach/Bowel: No abnormal bowel wall  thickening or evidence for bowel obstruction. No free fluid or free intraperitoneal air. Vascular/Lymphatic: Normal caliber abdominal aorta. Peripheral calcified atherosclerotic plaque. Prominent subcentimeter retroperitoneal lymph nodes. Reproductive: Heterogeneous prostate. Other: None. Musculoskeletal: No aggressive or acute appearing osseous lesions. Lumbar spine degenerative changes. Review of the MIP images confirms the above findings. IMPRESSION: Chest: 1. Large left and small right pleural effusions. Consolidation within the left lower lobe may represent atelectasis or infection. Patchy ground-glass opacities throughout the aerated lungs bilaterally favored to represent edema. Atypical infectious process not excluded. 2. No evidence for acute pulmonary embolus. 3. Similar-appearing mediastinal adenopathy, potentially reactive in etiology. Abdomen pelvis: 1. There is a residual fluid collection within the gallbladder fossa which is similar in size when compared to recent abdomen pelvic CT. Additionally, there is new perihepatic fluid. Possibility of bile leak not entirely excluded. Consider further evaluation with HIDA exam as clinically indicated. 2. Interval insertion right-sided double-J nephroureteral stent. No residual hydronephrosis. 3. Marked wall thickening of the urinary bladder. Recommend correlation with urinalysis to exclude the possibility of cystitis. Electronically Signed   By: Lovey Newcomer M.D.   On: 02/20/2018 21:08   Nm Hepatobiliary Including Gb  Result Date: 02/21/2018 CLINICAL DATA:  Abdominal pain post cholecystectomy 02/04/2018. Evaluate for bile leak. EXAM: NUCLEAR MEDICINE HEPATOBILIARY IMAGING TECHNIQUE: Sequential images of the abdomen were obtained out to 60 minutes following intravenous administration of radiopharmaceutical. RADIOPHARMACEUTICALS:  4.74 mCi Tc-50m  Choletec IV COMPARISON:  Abdominopelvic CT 02/20/2018 FINDINGS: There is homogeneous hepatic uptake with prompt  excretion into the biliary system and drainage into the small bowel. Some reflux of activity into the stomach is noted. There is no extraluminal activity to suggest a bile leak. IMPRESSION: No evidence of bile leak or biliary obstruction post cholecystectomy. Electronically Signed   By: Richardean Sale M.D.   On: 02/21/2018 14:04   Medications: . sodium chloride    . sodium chloride    . sodium chloride    .  sodium chloride    . ceFEPime (MAXIPIME) IV     . aspirin EC  81 mg Oral QHS  . calcium carbonate  800 mg of elemental calcium Oral TID WC  . Chlorhexidine Gluconate Cloth  6 each Topical Q0600  . Chlorhexidine Gluconate Cloth  6 each Topical Q0600  . [START ON 02/23/2018] Chlorhexidine Gluconate Cloth  6 each Topical Q0600  . dorzolamide-timolol  1 drop Both Eyes BID  . DULoxetine  60 mg Oral BID  . heparin  5,000 Units Subcutaneous Q8H  . insulin aspart  0-9 Units Subcutaneous Q4H  . insulin glargine  10 Units Subcutaneous Daily  . isosorbide mononitrate  120 mg Oral Daily  . labetalol  300 mg Oral BID  . mupirocin ointment  1 application Nasal BID  . NIFEdipine  60 mg Oral Daily  . nitroGLYCERIN  1 inch Topical Q6H  . pantoprazole  40 mg Oral Daily  . senna-docusate  1 tablet Oral BID  . torsemide  20 mg Oral BID  . traZODone  50-100 mg Oral QHS    Dialysis Orders: TTS high pt, 4hrs, 200, EDW noted 239lbs (pt state 229lbs)  Assessment/Plan:  1.  End-stage renal disease on hemodialysis.  We will plan for dialysis today on TTS schedule.    2.  Hematuria.  Does have a urinalysis consistent with urinary tract infection.  Would treat with antibiotics as per his primary team's recommendations, currently on cefepime  3.  Abdominal pain with recent cholecystectomy with concernf for bile leak W/u unrevealing and GI plans no further w/u.   4.  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  Is on calcium carbonate.  6.  Diabetes.  Continue  insulin with sliding scale.  Dispo: ok for discharge from our perspective.  Jannifer Hick MD 02/22/2018, 3:15 PM  [facility] Pager: [phone]

## 2018-02-22 NOTE — Discharge Summary (Signed)
Physician Discharge Summary  Jon Russell FYB:[id number] DOB: 12/24/1960 DOA: 02/20/2018  PCP: Verner Chol, MD  Admit date: 02/20/2018 Discharge date: 02/22/2018  Time spent: 45 minutes  Recommendations for Outpatient Follow-up:  1. Follow up with PCP 1-2 weeks for evaluation of symptoms 2. Follow up with urology for stent removal as scheduled 3. Continue dialysis schedule   Discharge Diagnoses:  Principal Problem:   Acute pulmonary edema (HCC) Active Problems:   ESRD on dialysis ([place])   Acute lower UTI   HTN (hypertension)   Diabetes type 2, controlled ([place])   Tobacco use   COPD (chronic obstructive pulmonary disease) (HCC)   Post-cholecystectomy syndrome   Polysubstance abuse ([place])   ESRD on hemodialysis ([place])   Abnormal abdominal CT scan   Discharge Condition: stable   Diet recommendation: heart healthy carb modified renal  Filed Weights   02/20/18 2230 02/21/18 0914 02/21/18 1950  Weight: 109.3 kg 115.9 kg 108.3 kg    History of present illness:  Jon Russell is a 58 y.o. male with a Past Medical History of ESRD on HD, CHF, COPD, DM type II, HTN, and polysubstance abuse; who presented 02/20/18 with shortness of breath after having recent hospitalization [facility] from 1/28-1/31 for abdominal pain requiring laparoscopic cholecystectomy and subsequent hydronephrosis requiring right ureteral stent placement.  Patient appeared to be fluid overloaded likely related to missing dialysis sessions due to issues regarding previously mentioned hospitalization.  Hospital Course:   1. Acute pulm edema - 1. Chest xray with pulmonary vascular congestion, pulmonary edema and bibasular plerual effusions. Likely related to missed dialysis sessions  2. Potassium 3.9 and creatinine 10.4 on admission. Dialyzed x2. Appreciate nephrology assistance. Will resume dialysis schedule at discharge 2. UTI - 1. Dysuria resolved at discharge. Urinalysis with greater than 50 WBC and large  leuks. S/p ureteral stent placement at end of Jan. Received Cefepime for 2 days. Urine culture with no growth at discharge. Will discharge with cipro. OP follow up 3. Post-cholecystectomy syndrome - 1. UTI vs bile leak, ? Bile leak on CT. HIDA scan negative for bile leak or obstruction. Appreciate GI assistance 4. Abd pain -  tolerating meals on day of discharge.  PO oxycodone PRN  5. ESRD - dialyzed on day of discharge to keep on schedule. 6. DM2 - CBG's running low this am.  1. HgA1c 7.9. recommend close OP follow up with PCP 7. HTN -  Poor control. Home meds include imdur, labetalol and demadex. continueing home med 8. COPD - not in exacerbation, continue home O2 9. Tobacco use: cessation counseling offered 10. Anemia: hg 9.2. related to CKD. Close to baseline. No s/ Procedures:  dialysis  Consultations:  nephrology  Discharge Exam: Vitals:   02/22/18 1515 02/22/18 1530  BP: (!) 151/54 (!) 181/81  Pulse: 62 63  Resp: 18 17  Temp:    SpO2:      General: awake alert sitting up in bed eating breakfast Cardiovascular: rrr no mgr  Respiratory: normal effort BS clear bilaterally no wheeze  Discharge Instructions   Discharge Instructions    Diet - low sodium heart healthy   Complete by:  As directed    Discharge instructions   Complete by:  As directed    Take medication as directed Follow up with PCP 1-2 weeks for evaluation of symptoms Follow up with urology as scheduled for stent removal. Continue usual dialysis schedule   Increase activity slowly   Complete by:  As directed  Allergies as of 02/22/2018      Reactions   Statins Other (See Comments)   Leg weakness      Medication List    TAKE these medications   acetaminophen 500 MG tablet Commonly known as:  TYLENOL Take 1,000 mg by mouth every 6 (six) hours as needed for mild pain.   albuterol 108 (90 Base) MCG/ACT inhaler Commonly known as:  PROVENTIL HFA;VENTOLIN HFA Inhale 2 puffs into the lungs  every 6 (six) hours as needed for wheezing or shortness of breath.   aspirin 81 MG EC tablet Take 81 mg by mouth at bedtime.   calcium carbonate 750 MG chewable tablet Commonly known as:  TUMS EX Chew 2-3 tablets by mouth See admin instructions. Take 3 tablets by mouth with meals and 2 tablets with snacks   ciprofloxacin 500 MG tablet Commonly known as:  CIPRO Take 1 tablet (500 mg total) by mouth 2 (two) times daily for 5 days.   dorzolamide-timolol 22.3-6.8 MG/ML ophthalmic solution Commonly known as:  COSOPT Place 1 drop into both eyes 2 (two) times daily.   DULoxetine 60 MG capsule Commonly known as:  CYMBALTA Take 60 mg by mouth 2 (two) times daily.   isosorbide mononitrate 120 MG 24 hr tablet Commonly known as:  IMDUR Take 120 mg by mouth daily.   labetalol 300 MG tablet Commonly known as:  NORMODYNE Take 300 mg by mouth 2 (two) times daily.   LEVEMIR FLEXTOUCH 100 UNIT/ML Pen Generic drug:  Insulin Detemir Inject 20 Units into the skin daily before breakfast.   NIFEdipine 60 MG 24 hr tablet Commonly known as:  PROCARDIA XL/NIFEDICAL XL Take 60 mg by mouth daily.   NOVOLOG FLEXPEN 100 UNIT/ML FlexPen Generic drug:  insulin aspart Inject 4-12 Units into the skin 3 (three) times daily as needed for high blood sugar (CBG 120). Per sliding scale based on CBG   oxyCODONE 15 MG immediate release tablet Commonly known as:  ROXICODONE Take 15 mg by mouth every 6 (six) hours as needed for pain.   OXYGEN Inhale 3 L into the lungs continuous.   pantoprazole 40 MG tablet Commonly known as:  PROTONIX Take 40 mg by mouth daily.   torsemide 20 MG tablet Commonly known as:  DEMADEX Take 20 mg by mouth 2 (two) times daily.   traZODone 50 MG tablet Commonly known as:  DESYREL Take 50-100 mg by mouth at bedtime.      Allergies  Allergen Reactions  . Statins Other (See Comments)    Leg weakness      The results of significant diagnostics from this  hospitalization (including imaging, microbiology, ancillary and laboratory) are listed below for reference.    Significant Diagnostic Studies: Dg Chest 2 View  Result Date: 02/20/2018 CLINICAL DATA:  Shortness of breath for 2-3 days, history CHF, has not been taking Lasix, smoker, COPD, diabetes mellitus, hypertension, hyperlipidemia, GERD EXAM: CHEST - 2 VIEW COMPARISON:  01/30/2018 FINDINGS: Enlargement of cardiac silhouette with pulmonary vascular congestion. Mild pulmonary edema. Bibasilar pleural effusions and atelectasis greater on LEFT. No pneumothorax. LEFT subclavian/axillary stent. Osseous structures unremarkable. Nephrostomy tube at upper abdomen on lateral view, not visualized on PA view. IMPRESSION: Enlargement of cardiac silhouette with pulmonary vascular congestion, pulmonary edema, and bibasilar pleural effusions/atelectasis LEFT greater than RIGHT. Electronically Signed   By: Lavonia Dana M.D.   On: 02/20/2018 19:20   Ct Angio Chest Pe W And/or Wo Contrast  Result Date: 02/20/2018 CLINICAL DATA:  Patient with  shortness of breath, nausea and vomiting. EXAM: CT ANGIOGRAPHY CHEST CT ABDOMEN AND PELVIS WITH CONTRAST TECHNIQUE: Multidetector CT imaging of the chest was performed using the standard protocol during bolus administration of intravenous contrast. Multiplanar CT image reconstructions and MIPs were obtained to evaluate the vascular anatomy. Multidetector CT imaging of the abdomen and pelvis was performed using the standard protocol during bolus administration of intravenous contrast. CONTRAST:  133mL ISOVUE-370 IOPAMIDOL (ISOVUE-370) INJECTION 76% COMPARISON:  CT abdomen pelvis 02/08/2018 FINDINGS: CTA CHEST FINDINGS Cardiovascular: Heart is enlarged. Small pericardial effusion. Thoracic aortic vascular calcifications. Main pulmonary artery is dilated as can be seen with pulmonary arterial hypertension. Coronary arterial vascular calcifications. Adequate opacification of the main  pulmonary artery. Motion artifact limits evaluation. No abnormal filling defects identified to suggest acute pulmonary embolus. Mediastinum/Nodes: 1.8 cm right paratracheal lymph node (image 47; series 5). 2.2 cm right hilar node (image 56; series 5). Small hiatal hernia. Otherwise normal appearance of the esophagus. Lungs/Pleura: Expiratory phase imaging. There is a large left and small right pleural effusion. Large area of consolidation throughout the left lower lobe. Patchy ground-glass opacities throughout the aerated left upper lobe. Patchy ground-glass opacities throughout the aerated right lung. 4 mm right middle lobe nodule (image 67; series 6). 5 mm right middle lobe nodule (image 75; series 6). Nodules are similar when compared to prior exam, suggestive of benign etiology. Interlobular septal thickening. Fluid along the fissures. No pneumothorax. Musculoskeletal: No aggressive or acute appearing osseous lesions. Stable sclerosis within the posterior right fifth rib. Review of the MIP images confirms the above findings. CT ABDOMEN and PELVIS FINDINGS Hepatobiliary: Liver is normal in size and contour. Re demonstrated 5.0 x 3.9 cm fluid collection within the gallbladder fossa (image 41; series 12). Interval development of small amount of perihepatic fluid (image 18; series 12). Mild heterogeneous enhancement of the adjacent hepatic parenchyma (image 49; series 12). Fatty deposition adjacent to the falciform ligament. No intrahepatic biliary ductal dilatation. Pancreas: Unremarkable Spleen: Normal in size without focal abnormality. Adrenals/Urinary Tract: Normal adrenal glands. Kidneys enhance symmetrically with contrast. Interval insertion right double-J nephroureteral stent which appears in appropriate position. Marked wall thickening of the urinary bladder. Stomach/Bowel: No abnormal bowel wall thickening or evidence for bowel obstruction. No free fluid or free intraperitoneal air. Vascular/Lymphatic:  Normal caliber abdominal aorta. Peripheral calcified atherosclerotic plaque. Prominent subcentimeter retroperitoneal lymph nodes. Reproductive: Heterogeneous prostate. Other: None. Musculoskeletal: No aggressive or acute appearing osseous lesions. Lumbar spine degenerative changes. Review of the MIP images confirms the above findings. IMPRESSION: Chest: 1. Large left and small right pleural effusions. Consolidation within the left lower lobe may represent atelectasis or infection. Patchy ground-glass opacities throughout the aerated lungs bilaterally favored to represent edema. Atypical infectious process not excluded. 2. No evidence for acute pulmonary embolus. 3. Similar-appearing mediastinal adenopathy, potentially reactive in etiology. Abdomen pelvis: 1. There is a residual fluid collection within the gallbladder fossa which is similar in size when compared to recent abdomen pelvic CT. Additionally, there is new perihepatic fluid. Possibility of bile leak not entirely excluded. Consider further evaluation with HIDA exam as clinically indicated. 2. Interval insertion right-sided double-J nephroureteral stent. No residual hydronephrosis. 3. Marked wall thickening of the urinary bladder. Recommend correlation with urinalysis to exclude the possibility of cystitis. Electronically Signed   By: Lovey Newcomer M.D.   On: 02/20/2018 21:08   Ct Abdomen Pelvis W Contrast  Result Date: 02/20/2018 CLINICAL DATA:  Patient with shortness of breath, nausea and vomiting. EXAM: CT ANGIOGRAPHY CHEST  CT ABDOMEN AND PELVIS WITH CONTRAST TECHNIQUE: Multidetector CT imaging of the chest was performed using the standard protocol during bolus administration of intravenous contrast. Multiplanar CT image reconstructions and MIPs were obtained to evaluate the vascular anatomy. Multidetector CT imaging of the abdomen and pelvis was performed using the standard protocol during bolus administration of intravenous contrast. CONTRAST:  169mL  ISOVUE-370 IOPAMIDOL (ISOVUE-370) INJECTION 76% COMPARISON:  CT abdomen pelvis 02/08/2018 FINDINGS: CTA CHEST FINDINGS Cardiovascular: Heart is enlarged. Small pericardial effusion. Thoracic aortic vascular calcifications. Main pulmonary artery is dilated as can be seen with pulmonary arterial hypertension. Coronary arterial vascular calcifications. Adequate opacification of the main pulmonary artery. Motion artifact limits evaluation. No abnormal filling defects identified to suggest acute pulmonary embolus. Mediastinum/Nodes: 1.8 cm right paratracheal lymph node (image 47; series 5). 2.2 cm right hilar node (image 56; series 5). Small hiatal hernia. Otherwise normal appearance of the esophagus. Lungs/Pleura: Expiratory phase imaging. There is a large left and small right pleural effusion. Large area of consolidation throughout the left lower lobe. Patchy ground-glass opacities throughout the aerated left upper lobe. Patchy ground-glass opacities throughout the aerated right lung. 4 mm right middle lobe nodule (image 67; series 6). 5 mm right middle lobe nodule (image 75; series 6). Nodules are similar when compared to prior exam, suggestive of benign etiology. Interlobular septal thickening. Fluid along the fissures. No pneumothorax. Musculoskeletal: No aggressive or acute appearing osseous lesions. Stable sclerosis within the posterior right fifth rib. Review of the MIP images confirms the above findings. CT ABDOMEN and PELVIS FINDINGS Hepatobiliary: Liver is normal in size and contour. Re demonstrated 5.0 x 3.9 cm fluid collection within the gallbladder fossa (image 41; series 12). Interval development of small amount of perihepatic fluid (image 18; series 12). Mild heterogeneous enhancement of the adjacent hepatic parenchyma (image 49; series 12). Fatty deposition adjacent to the falciform ligament. No intrahepatic biliary ductal dilatation. Pancreas: Unremarkable Spleen: Normal in size without focal  abnormality. Adrenals/Urinary Tract: Normal adrenal glands. Kidneys enhance symmetrically with contrast. Interval insertion right double-J nephroureteral stent which appears in appropriate position. Marked wall thickening of the urinary bladder. Stomach/Bowel: No abnormal bowel wall thickening or evidence for bowel obstruction. No free fluid or free intraperitoneal air. Vascular/Lymphatic: Normal caliber abdominal aorta. Peripheral calcified atherosclerotic plaque. Prominent subcentimeter retroperitoneal lymph nodes. Reproductive: Heterogeneous prostate. Other: None. Musculoskeletal: No aggressive or acute appearing osseous lesions. Lumbar spine degenerative changes. Review of the MIP images confirms the above findings. IMPRESSION: Chest: 1. Large left and small right pleural effusions. Consolidation within the left lower lobe may represent atelectasis or infection. Patchy ground-glass opacities throughout the aerated lungs bilaterally favored to represent edema. Atypical infectious process not excluded. 2. No evidence for acute pulmonary embolus. 3. Similar-appearing mediastinal adenopathy, potentially reactive in etiology. Abdomen pelvis: 1. There is a residual fluid collection within the gallbladder fossa which is similar in size when compared to recent abdomen pelvic CT. Additionally, there is new perihepatic fluid. Possibility of bile leak not entirely excluded. Consider further evaluation with HIDA exam as clinically indicated. 2. Interval insertion right-sided double-J nephroureteral stent. No residual hydronephrosis. 3. Marked wall thickening of the urinary bladder. Recommend correlation with urinalysis to exclude the possibility of cystitis. Electronically Signed   By: Lovey Newcomer M.D.   On: 02/20/2018 21:08   Nm Hepatobiliary Including Gb  Result Date: 02/21/2018 CLINICAL DATA:  Abdominal pain post cholecystectomy 02/04/2018. Evaluate for bile leak. EXAM: NUCLEAR MEDICINE HEPATOBILIARY IMAGING  TECHNIQUE: Sequential images of the abdomen were obtained  out to 60 minutes following intravenous administration of radiopharmaceutical. RADIOPHARMACEUTICALS:  4.74 mCi Tc-26m  Choletec IV COMPARISON:  Abdominopelvic CT 02/20/2018 FINDINGS: There is homogeneous hepatic uptake with prompt excretion into the biliary system and drainage into the small bowel. Some reflux of activity into the stomach is noted. There is no extraluminal activity to suggest a bile leak. IMPRESSION: No evidence of bile leak or biliary obstruction post cholecystectomy. Electronically Signed   By: Richardean Sale M.D.   On: 02/21/2018 14:04    Microbiology: Recent Results (from the past 240 hour(s))  Urine culture     Status: None   Collection Time: 02/21/18  7:41 AM  Result Value Ref Range Status   Specimen Description URINE, RANDOM  Final   Special Requests NONE  Final   Culture   Final    NO GROWTH Performed at [facility], [address], [place], [place] [address]    Report Status 02/22/2018 FINAL  Final  MRSA PCR Screening     Status: Abnormal   Collection Time: 02/21/18 11:05 AM  Result Value Ref Range Status   MRSA by PCR POSITIVE (A) NEGATIVE Final    Comment:        The GeneXpert MRSA Assay (FDA approved for NASAL specimens only), is one component of a comprehensive MRSA colonization surveillance program. It is not intended to diagnose MRSA infection nor to guide or monitor treatment for MRSA infections. RESULT CALLED TO, READ BACK BY AND VERIFIED WITH: Regan Rakers RN 13:00 02/21/18 ([email or web address]) Performed at [facility], [place] [address] [place], [place] [address]      Labs: Basic Metabolic Panel: Recent Labs  Lab 02/20/18 1851 02/22/18 0731  NA 140 138  K 3.9 4.3  CL 107 101  CO2 18* 26  GLUCOSE 184* 108*  BUN 63* 33*  CREATININE 10.47* 6.34*  CALCIUM 8.5* 8.5*   Liver Function Tests: Recent Labs  Lab 02/20/18 1851 02/22/18 0731  AST 14* 13*  ALT 14 12  ALKPHOS 86 83   BILITOT 0.5 0.7  PROT 7.1 7.0  ALBUMIN 2.9* 2.8*   Recent Labs  Lab 02/20/18 1851  LIPASE 112*   No results for input(s): AMMONIA in the last 168 hours. CBC: Recent Labs  Lab 02/20/18 1851 02/22/18 1448  WBC 10.2 7.3  HGB 9.7* 9.3*  HCT 33.2* 30.9*  MCV 99.7 97.5  PLT 390 326   Cardiac Enzymes: Recent Labs  Lab 02/20/18 2300 02/21/18 0401 02/21/18 1000  TROPONINI 0.04* 0.04* 0.03*   BNP: BNP (last 3 results) No results for input(s): BNP in the last 8760 hours.  ProBNP (last 3 results) No results for input(s): PROBNP in the last 8760 hours.  CBG: Recent Labs  Lab 02/21/18 2037 02/22/18 0029 02/22/18 0326 02/22/18 0742 02/22/18 1222  GLUCAP 167* 143* 102* 97 140*       Signed:  Radene Gunning NP [facility] 02/22/2018, 4:05 PM

## 2018-08-13 DEATH — deceased
